# Patient Record
Sex: Male | Born: 1950 | Race: White | Hispanic: No | Marital: Single | Smoking: Never smoker
Health system: Southern US, Community
[De-identification: ages and names within clinical notes are randomized; demographics above are authoritative.]

## PROBLEM LIST (undated history)

## (undated) DIAGNOSIS — G40909 Epilepsy, unspecified, not intractable, without status epilepticus: Secondary | ICD-10-CM

## (undated) DIAGNOSIS — F329 Major depressive disorder, single episode, unspecified: Secondary | ICD-10-CM

## (undated) DIAGNOSIS — F79 Unspecified intellectual disabilities: Secondary | ICD-10-CM

## (undated) DIAGNOSIS — G809 Cerebral palsy, unspecified: Secondary | ICD-10-CM

## (undated) HISTORY — DX: Epilepsy, unspecified, not intractable, without status epilepticus: G40.909

## (undated) HISTORY — DX: Major depressive disorder, single episode, unspecified: F32.9

---

## 1998-03-25 ENCOUNTER — Other Ambulatory Visit: Admission: RE | Admit: 1998-03-25 | Discharge: 1998-03-25 | Payer: Self-pay | Admitting: Family Medicine

## 1999-10-20 ENCOUNTER — Emergency Department (HOSPITAL_COMMUNITY): Admission: EM | Admit: 1999-10-20 | Discharge: 1999-10-20 | Payer: Self-pay | Admitting: *Deleted

## 2000-09-13 ENCOUNTER — Emergency Department (HOSPITAL_COMMUNITY): Admission: EM | Admit: 2000-09-13 | Discharge: 2000-09-13 | Payer: Self-pay | Admitting: Emergency Medicine

## 2000-09-20 ENCOUNTER — Emergency Department (HOSPITAL_COMMUNITY): Admission: EM | Admit: 2000-09-20 | Discharge: 2000-09-20 | Payer: Self-pay | Admitting: Emergency Medicine

## 2000-09-20 ENCOUNTER — Encounter: Payer: Self-pay | Admitting: Emergency Medicine

## 2001-02-23 ENCOUNTER — Emergency Department (HOSPITAL_COMMUNITY): Admission: EM | Admit: 2001-02-23 | Discharge: 2001-02-23 | Payer: Self-pay | Admitting: Emergency Medicine

## 2001-02-23 ENCOUNTER — Encounter: Payer: Self-pay | Admitting: Emergency Medicine

## 2002-01-11 ENCOUNTER — Encounter: Payer: Self-pay | Admitting: Emergency Medicine

## 2002-01-11 ENCOUNTER — Emergency Department (HOSPITAL_COMMUNITY): Admission: EM | Admit: 2002-01-11 | Discharge: 2002-01-11 | Payer: Self-pay | Admitting: Emergency Medicine

## 2003-02-24 ENCOUNTER — Encounter: Payer: Self-pay | Admitting: Emergency Medicine

## 2003-02-24 ENCOUNTER — Emergency Department (HOSPITAL_COMMUNITY): Admission: EM | Admit: 2003-02-24 | Discharge: 2003-02-24 | Payer: Self-pay | Admitting: Emergency Medicine

## 2004-03-18 ENCOUNTER — Emergency Department (HOSPITAL_COMMUNITY): Admission: EM | Admit: 2004-03-18 | Discharge: 2004-03-18 | Payer: Self-pay | Admitting: Family Medicine

## 2004-03-20 ENCOUNTER — Emergency Department (HOSPITAL_COMMUNITY): Admission: EM | Admit: 2004-03-20 | Discharge: 2004-03-20 | Payer: Self-pay | Admitting: Emergency Medicine

## 2005-10-25 ENCOUNTER — Inpatient Hospital Stay (HOSPITAL_COMMUNITY): Admission: EM | Admit: 2005-10-25 | Discharge: 2005-10-28 | Payer: Self-pay | Admitting: Emergency Medicine

## 2005-10-25 ENCOUNTER — Encounter (INDEPENDENT_AMBULATORY_CARE_PROVIDER_SITE_OTHER): Payer: Self-pay | Admitting: Specialist

## 2005-10-26 ENCOUNTER — Encounter: Payer: Self-pay | Admitting: Internal Medicine

## 2005-11-21 ENCOUNTER — Emergency Department (HOSPITAL_COMMUNITY): Admission: EM | Admit: 2005-11-21 | Discharge: 2005-11-21 | Payer: Self-pay | Admitting: Family Medicine

## 2005-11-23 ENCOUNTER — Emergency Department (HOSPITAL_COMMUNITY): Admission: EM | Admit: 2005-11-23 | Discharge: 2005-11-23 | Payer: Self-pay | Admitting: Family Medicine

## 2006-02-27 ENCOUNTER — Emergency Department (HOSPITAL_COMMUNITY): Admission: EM | Admit: 2006-02-27 | Discharge: 2006-02-27 | Payer: Self-pay | Admitting: Family Medicine

## 2007-08-02 ENCOUNTER — Emergency Department (HOSPITAL_COMMUNITY): Admission: EM | Admit: 2007-08-02 | Discharge: 2007-08-02 | Payer: Self-pay | Admitting: Emergency Medicine

## 2011-01-09 ENCOUNTER — Encounter: Payer: Self-pay | Admitting: Family Medicine

## 2011-05-06 NOTE — H&P (Signed)
NAME:  Howard Best, Howard Best NO.:  [id number]   MEDICAL RECORD NO.:  [id number]          PATIENT TYPE:  EMS   LOCATION:  ED                           FACILITY:  [facility]   PHYSICIAN:  Nelma Rothman, MD   DATE OF BIRTH:  10/06/1951   DATE OF ADMISSION:  10/25/2005  DATE OF DISCHARGE:                                HISTORY & PHYSICAL   PRIMARY CARE PHYSICIAN:  Dr. Dara Hoyer   CHIEF COMPLAINT:  Melena and coffee-ground emesis.   HISTORY OF PRESENT ILLNESS:  The patient is a 60 year old male with a  history of palsy who was brought in by his caregiver after the onset of  liquid, black, foul-smelling diarrhea last night. He reportedly had already  this morning. He was also noted to have some black vomit, but none since his  arrival to the emergency department. There is actually a small amount of  what appears to be coffee-ground emesis on the front of his T-shirt. He also  has a history of mental retardation so the history is somewhat limited,  although he reportedly did complain of some abdominal pain earlier today. He  is accompanied by his caregiver as well as his mother, who is his healthcare  power of attorney. The stool guaiac was positive. There is no history of  nonsteroidals or alcohol use.   PAST MEDICAL HISTORY:  1.  Cerebral palsy.  2.  Seizure disorder.   No known drug allergies.   MEDICATIONS:  1.  Depakote 500 mg p.o. t.i.d.  2.  Gabapentin 600 mg p.o. q.6h.  3.  Lexapro 20 mg p.o. daily.  4.  Colace 100 mg one p.o. q.h.s.  5.  Monistat 2% cream applied b.i.d. to the groin.  6.  Amoxicillin.   SOCIAL HISTORY:  He lives at [place]. He denies any tobacco,  alcohol, or IV drug use.   FAMILY HISTORY:  Positive for heart disease in the mother but no history of  cancer other than skin cancer.   REVIEW OF SYSTEMS:  Again, somewhat limited as the patient is unable to  relate ______________ to vomiting and melena as described in HPI.  Otherwise,  10-point review of systems is negative per the caregiver.   PHYSICAL EXAMINATION:  VITAL SIGNS:  Temperature 99.2, pulse 87, blood  pressure 124/84, respiratory rate 20, saturating 97% on room air.  GENERAL:  He is in no apparent distress.  HEENT:  Mucous membranes are moist. There are no exudates.  NECK:  Supple with no lymphadenopathy and no thyromegaly.  HEART:  Regular rate and rhythm with no murmurs, rubs, or gallops.  LUNGS:  Clear to auscultation bilaterally with no wheezes or crackles.  ABDOMEN:  Soft, nontender, nondistended, with normal active bowel sounds.  EXTREMITIES:  There is no edema.  RECTAL:  Stool guaiac is positive per the emergency department.  NEUROLOGIC:  He follows ___________.   ____________, chloride 98, bicarb 28, BUN 22, creatinine 0.7, glucose 94,  calcium 8.4, total protein 6, albumin 3, AST 27, ALP 20, alkaline  phosphatase 33, total bilirubin 0.9. White blood cell count 13.4,  hemoglobin  15.3, hematocrit 43.9, and platelet count 99. Acute abdominal series is  pending.   ASSESSMENT AND PLAN:  1.  Presumed upper gastrointestinal bleed. Most likely differential includes      gastritis versus peptic ulcer disease. Will place on IV Protonix. The      patient will n.p.o.  2.  Seizure disorder. Continue gabapentin and Depakote.  3.  Thrombocytopenia of unclear duration. It may be that this goes along      with viral etiology, consistent with his symptomatology including sore      throat and low-grade fevers yesterday. Will continue to monitor.  4.  Anticipate back to assisted living facility when medically stable.           ______________________________  Nelma Rothman, MD     RAR/MEDQ  D:  10/25/2005  T:  10/25/2005  Job:  [id number]

## 2011-05-06 NOTE — Discharge Summary (Signed)
NAME:  Howard Best, Howard Best              ACCOUNT NO.:  [id number]   MEDICAL RECORD NO.:  [id number]          PATIENT TYPE:  INP   LOCATION:  2536                         FACILITY:  [facility]   PHYSICIAN:  Hillery Aldo, M.D.   DATE OF BIRTH:  [birth year]   DATE OF ADMISSION:  10/25/2005  DATE OF DISCHARGE:                                 DISCHARGE SUMMARY   PRIMARY CARE PHYSICIAN:  Dr. Dara Hoyer   GASTROENTEROLOGIST:  Dr. Elnoria Howard   NEUROLOGIST:  Dr. Lesia Sago   DISCHARGE DIAGNOSES:  1.  Upper gastrointestinal bleed secondary to gastric ulcers, status post      esophagogastroduodenoscopy.  2.  Thrombocytopenia of uncertain etiology, possibly a reaction to Depakote.  3.  Transient coagulopathy, resolved.  4.  Seizure disorder.  5.  Fever with leukocytosis, likely secondary to viral pharyngitis.  6.  History of elevated hemoglobin.   DISCHARGE MEDICATIONS:  1.  Protonix 40 mg b.i.d. x1 month, then decrease to daily.  2.  Neurontin 600 mg p.o. q.i.d.  3.  Monistat 2% cream to groin b.i.d.  4.  Lexapro 20 mg p.o. daily.  5.  Topamax 25 mg p.o. b.i.d. x1 week, then 25 mg q.a.m. and 50 mg q.p.m. x1      week, then 50 mg p.o. b.i.d.  6.  Depakote 500 mg b.i.d. x3 weeks, then decrease to 500 mg daily x3 weeks,      then stop.   CONSULTATIONS:  1.  Dr. Elnoria Howard of gastrointestinal.  2.  Dr. Anne Hahn of neurology.   PROCEDURES AND DIAGNOSTIC STUDIES:  1.  Acute abdominal series on October 25, 2005, showed a mild ileus with no      free intraperitoneal gas, a dysplastic right hip joint, and cardiomegaly      with vascular congestion and bibasilar atelectasis.  2.  Abdominal ultrasound on October 26, 2005, showed no evidence for      cirrhosis. The sonogram was normal.  3.  Esophagogastroduodenoscopy on October 26, 2005, showed superficial      gastric ulcers.  4.  Repeat chest x-ray on October 26, 2005, showed some air space disease in      the right lower lung zone thought to represent  atelectasis or early      infection.   DISCHARGE LABORATORY VALUES:  Hemoglobin was 13.8, hematocrit 38.4, white  blood cell count 9.0, and platelets 73. Ferritin was 117, transferrin 143,  iron 29, total iron binding capacity 197, and percent saturation 15. Sodium  was 138, potassium 4.0, chloride 103, bicarb 28, BUN 7, creatinine 0.7, and  glucose 87. Rapid strep screen was negative. At the time of this dictation,  urine and blood cultures are negative.   BRIEF ADMISSION HISTORY OF PRESENT ILLNESS:  The patient is a 60 year old  male with a past medical history of mental retardation and seizure disorder  who is admitted for workup of hematemesis and heme positive stools. The  patient has a history of thrombocytopenia dating back for approximately 1  month. He was admitted for further evaluation and consultation with  gastrointestinal for an EGD.  HOSPITAL COURSE BY PROBLEM:  #1 - UPPER GASTROINTESTINAL BLEED. The patient  was admitted and given IV fluid rehydration. Dr. Elnoria Howard was called in  consultation and proceed with an upper endoscopy on October 26, 2005. The  endoscopy did show some superficial gastric ulcers and it was advised that  the patient continue on proton pump inhibitor therapy twice daily for 1  month and then daily. His hemoglobin remained stable throughout the course  of his hospitalization with no further episodes of hematemesis or melena. He  is stable for discharge but should have his hemoglobin and hematocrit  followed closely for the next few weeks.   #2 - THROMBOCYTOPENIA. The patient's primary care physician was contacted  and the patient had a documented platelet count of 99 in August 2006. Prior  to this, his platelets were normal. There is no underlying evidence of liver  pathology to explain his thrombocytopenia. Given this, it was thought that  Depakote may be inducing thrombocytopenia so neurology was consulted to see  if there were alternative  anti-seizure medications that could be used. He  was seen by Dr. Anne Hahn who recommended starting Topamax and a very slow  titration off the Depakote. He should follow up with Dr. Anne Hahn as an  outpatient.   #3 - COAGULOPATHY. The patient did have one elevated protime. All other  liver function studies were normal. On repeat check, his protime was normal.  It is unclear if this was just a spurious laboratory error or a transient  coagulopathy.   #4 - SEIZURE DISORDER. The patient remained stable on his usual  anticonvulsants. He was started on Topamax with a dose titration down of his  Depakote with a plan to slowly titrate off the Depakote.   #5 - FEVER WITH LEUKOCYTOSIS. The only complaint the patient had was  pharyngitis. He was pan cultured with blood and urine cultures as well as a  rapid strep screen on his posterior pharynx. All culture data has so far  been negative. His rapid strep was negative as well. It is likely that he  has a viral pharyngitis. No antibiotics were administered during the course  of his hospital stay and his white blood cell count has normalized.   #6 - HISTORY OF ELEVATED HEMOGLOBIN. Given the patient's history of elevated  hemoglobin, a ferritin, transferrin, and iron studies were done. There was  no evidence of hemachromatosis based on the results, which are noted above.   #7 - DISPOSITION. The patient will be discharged to his assisted living  facility today. He should follow up with Dr. Anne Hahn in 2-4 weeks and with  his primary care physician next week for a check of his hemoglobin.           ______________________________  Hillery Aldo, M.D.     CR/MEDQ  D:  10/28/2005  T:  10/28/2005  Job:  [id number]   cc:   Teena Irani. Arlyce Dice, M.D.  Fax: [phone]   C. Lesia Sago, M.D.  Fax: [phone]   Jordan Hawks. Elnoria Howard, MD  Fax: [phone]

## 2011-05-06 NOTE — Consult Note (Signed)
NAME:  Howard Best, Howard Best NO.:  [id number]   MEDICAL RECORD NO.:  [id number]          PATIENT TYPE:  INP   LOCATION:  6712                         FACILITY:  [facility]   PHYSICIAN:  Marlan Palau, M.D.  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  This patient was admitted for  problem with emesis coffee-ground material, black stools, heme-positive.  Patient has been on Depakote and Neurontin for seizures which are somewhat  active, having several a month.  Patient's last seizure that was definite  was approximately three weeks ago.  Patient has had some chronic problems  with thrombocytopenia with platelet level around 100.  Patient has had some  drop in the platelet level to around 88.  Due to the recent GI bleed, some  question whether he will be able to come off of the Depakote and be switched  to something else as this may be the source of thrombocytopenia.  Neurology  is consulted for this reason.   PAST MEDICAL HISTORY:  1.  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  2.  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  3.  History of seizure disorder.  4.  Mental retardation.  5.  GI bleed as above.   CURRENT MEDICATIONS:  1.  Depakote 500 mg t.i.d.  2.  Lexapro 20 mg daily.  3.  Neurontin 600 mg q.6h.  4.  Protonix 40 mg b.i.d.  5.  Patient is on some Tylenol p.r.n.   ADMISSION LABORATORY DATA:  NO KNOWN DRUG ALLERGIES.   Does not smoke or drink.   SOCIAL HISTORY:  Patient is single.  Lives in [facility].  Patient has at  least two other siblings.   FAMILY HISTORY:  Notable for problems with heart disease in mother.  History  of  skin cancer in the family.   REVIEW OF SYSTEMS:  Very difficult to obtain.  Patient denies any headache,  chest pain, abdominal pain.  Really does not talk much otherwise.  Patient  just in general does not feel well.   PHYSICAL EXAMINATION:  GENERAL APPEARANCE:  This patient is a microcephalic,  alert male who is at times cooperative and other times not.  VITAL SIGNS:  Blood pressure is 119/73, heart rate 83, respiratory rate 20,  temperature 100.3, T-max 101.3.  HEENT:  Head is atraumatic.  Eyes:  Pupils are round and reactive to light.  Discs are difficult to visualize but appear to be flat bilaterally. Patient  does have frequent coughing.  NECK:  Supple.  No carotid bruits noted.  RESPIRATORY:  Clear.  CARDIOVASCULAR:  Regular rate and rhythm with no obvious murmurs or rubs  noted.  EXTREMITIES:  Patient has no significant edema of the extremities.  Arms and  legs are in flexion.  NEUROLOGIC:  Patient cooperates poorly with this.  He does blink to threat  bilaterally.  Otherwise visual field testing is difficult.  Patient will  verbalize some, somewhat dysarthric speech.  Patient has ability to grip  with both hands, right greater than left.  Really will not follow commands  well with either lower extremity.  Deep tendon reflexes are fairly  symmetric.  Toes are neutral bilaterally.  Patient will not follow commands  for cerebellar testing, other than he will try to grasp examiner's hands and  can do this with both arms.  Patient was not ambulated.   LABORATORY DATA:  Notable for white count of 8.7, hemoglobin 14.0,  hematocrit 40.7, MCV 98.6, platelets 88.  INR 1.0.  Admission INR of 1.5.  Sodium 133, potassium 4.1, chloride 98, CO2 28, glucose 94, BUN 22,  creatinine 0.7.  Total bilirubin 0.9, alkaline phosphatase 33, SGOT 27, SGPT  20, total protein 6.0, albumin 3.0, calcium 8.4.   IMPRESSION:  1.  History of chronic seizure disorder under poor control.  2.  Static  encephalopathy with mental retardation.  3.  Gastrointestinal bleed secondary to gastritis.  4.  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   At this point, will attempt to switch patient over from Depakote to Topamax.  Will gradually taper off Depakote by one tablet every three to four weeks  and get patient up to 50 mg twice a day with the Topamax by going up by 25  mg a week.  Patient will need to follow up through our office in about six  weeks.  Need to watch out for increasing seizure frequency.  We need to  follow platelets on a regular basis once he is discharged.      Marlan Palau, M.D.  Electronically Signed     CKW/MEDQ  D:  10/26/2005  T:  10/27/2005  Job:  [id number]   cc:   [facility]  [address]  [address]

## 2011-05-06 NOTE — Consult Note (Signed)
NAME:  Howard Best, Howard Best              ACCOUNT NO.:  [id number]   MEDICAL RECORD NO.:  [id number]          PATIENT TYPE:  INP   LOCATION:  0102                         FACILITY[facility]  [facility]   PHYSICIAN:  Jordan Hawks. Elnoria Howard, MD    DATE OF BIRTH:  22-Jun-1951   DATE OF CONSULTATION:  10/25/2005  DATE OF DISCHARGE:                                   CONSULTATION   REASON FOR CONSULTATION:  Melena.   REFERRING PHYSICIAN:  Teena Irani. Arlyce Dice, M.D.   HISTORY OF PRESENT ILLNESS:  This is a 60 year old white male with a history  of cerebral palsy and mental retardation who is brought into the hospital  for further evaluation of reported melena.  The patient lives in a group  home, and the nurse at the group home states that he had black vomitus and  also melena that was acute.  There is no prior history of these type of  findings.  Recently, the patient was evaluated by Dr. Arlyce Dice at [facility], I believe on Sunday or Monday, for an upper respiratory  infection.  At that time, he was uncooperative to be able to provide a  sputum culture, and he was empirically treated.  There was also a report  that he was started on ibuprofen for general aches and pains.  He has no  prior history of NSAID use or any known alcohol use.  The history is  difficult to obtain from the patient, as he is nonverbal at this time.  The  history was obtained from his mother, Howard Best, who is the power of  attorney.  Her phone number is [phone].   PAST MEDICAL HISTORY:  As stated above.   PAST SURGICAL HISTORY:  As stated above.   ALLERGIES:  No known drug allergies.   MEDICATIONS:  1.  Depakote 500 mg p.o. t.i.d.  2.  Gabapentin 600 mg q.6h.  3.  Lexapro 20 mg daily.  4.  Colace 100 mg q.h.s.  5.  Monistat 2% cream b.i.d. to groin.  6.  Amoxicillin.   SOCIAL HISTORY:  Patient lives at [facility].  No history of any  tobacco, alcohol, or illegal drug use.   FAMILY HISTORY:   Noncontributory for the current admission.   REVIEW OF SYSTEMS:  Unable to obtain at this time.   PHYSICAL EXAMINATION:  VITAL SIGNS:  Blood pressure 124/84, heart rate 87,  respirations 18, temperature 99.2.  Pulse ox is 97%.  GENERAL:  The patient is awake.  He is not communicating verbally at this  time.  HEENT:  Appears to be normocephalic and atraumatic.  Extraocular muscles are  intact.  Pupils are equal, round and reactive to light.  NECK:  Supple with no lymphadenopathy.  LUNGS:  Difficult to examine; however, the right posterior and anterior  lungs are clear, as unable to adequately assess his left lung secondary to  his positioning.  CARDIOVASCULAR:  Regular rate and rhythm without murmurs, rubs or gallops.  ABDOMEN:  Flat, soft, nontender, nondistended.  Unable to palpate for any  hepatosplenomegaly.  EXTREMITIES:  No clubbing,  cyanosis or edema.  SKIN:  There is no evidence of any telangiectasias or palmar erythema.  RECTAL:  Negative for any palpable masses.  There is gross evidence of  melena, and it is guaiac positive.   LABORATORY VALUES:  White blood cell count 13.4, hemoglobin 15.3, platelet  count 99.  Sodium 133, potassium 4.1, chloride 98, CO2 28, BUN 22,  creatinine 0.7, glucose 94, AST 27, ALT 20, alk phos 33, total bili 0.9,  albumin 3.  PT is 18.2, INR 1.5.   IMPRESSION:  1.  Melena/coffee-ground emesis.  2.  Thrombocytopenia.  3.  Elevated INR.  4.  Cerebral palsy.  5.  Seizure disorder.   I have evaluated the patient.  It is clear that he does have melena at this  time.  Because of his uncooperative nature with the throat culture, an NG  tube will not be pursued at this time.  He is hemodynamically stable, and  there is no evidence of any rapid GI blood loss.  He does require further  evaluation with an upper endoscopy.  I am uncertain why the patient has an  elevated INR as well as thrombocytopenia.  There is no physical evidence of  cirrhosis at  this time, and he does not have any risk factors.  Review of  his medication does not raise any suspicions for medication-induced  cirrhosis.  Additionally, the patient does not appear to be in a septic  state, which can result in these types of abnormalities.   PLAN:  1.  EEG tomorrow.  2.  Agree with Protonix.  3.  Follow H&H.  4.  Transfuse as necessary.  5.  Hold all NSAIDs.  6.  Repeat PT/INR in the following days as well as a CBC to discern any      change in the initial abnormalities.  The patient may require a CT scan      of the abdomen in order to evaluate for any liver abnormalities or      evidence of portal hypertension.  The EGD will also help to define if      there is any evidence of varices.      Jordan Hawks Elnoria Howard, MD  Electronically Signed     PDH/MEDQ  D:  10/25/2005  T:  10/25/2005  Job:  [id number]   cc:   Teena Irani. Arlyce Dice, M.D.  Fax: [phone]

## 2020-09-10 ENCOUNTER — Emergency Department (HOSPITAL_COMMUNITY): Payer: Medicare Other

## 2020-09-10 ENCOUNTER — Encounter (HOSPITAL_COMMUNITY): Payer: Self-pay | Admitting: Emergency Medicine

## 2020-09-10 ENCOUNTER — Emergency Department (HOSPITAL_BASED_OUTPATIENT_CLINIC_OR_DEPARTMENT_OTHER): Payer: Medicare Other

## 2020-09-10 ENCOUNTER — Emergency Department (HOSPITAL_COMMUNITY)
Admission: EM | Admit: 2020-09-10 | Discharge: 2020-09-10 | Disposition: A | Discharge status: Home / Self Care | Payer: Medicare Other | Attending: Emergency Medicine | Admitting: Emergency Medicine

## 2020-09-10 DIAGNOSIS — R609 Edema, unspecified: Secondary | ICD-10-CM | POA: Diagnosis not present

## 2020-09-10 HISTORY — DX: Unspecified intellectual disabilities: F79

## 2020-09-10 HISTORY — DX: Cerebral palsy, unspecified: G80.9

## 2020-09-10 LAB — CBC WITH DIFFERENTIAL/PLATELET
Abs Immature Granulocytes: 0.02 10*3/uL (ref 0.00–0.07)
Basophils Absolute: 0.1 10*3/uL (ref 0.0–0.1)
Basophils Relative: 1 %
Eosinophils Absolute: 0.2 10*3/uL (ref 0.0–0.5)
Eosinophils Relative: 2 %
HCT: 45.3 % (ref 39.0–52.0)
Hemoglobin: 15.4 g/dL (ref 13.0–17.0)
Immature Granulocytes: 0 %
Lymphocytes Relative: 31 %
Lymphs Abs: 2 10*3/uL (ref 0.7–4.0)
MCH: 32.6 pg (ref 26.0–34.0)
MCHC: 34 g/dL (ref 30.0–36.0)
MCV: 95.8 fL (ref 80.0–100.0)
Monocytes Absolute: 0.8 10*3/uL (ref 0.1–1.0)
Monocytes Relative: 12 %
Neutro Abs: 3.6 10*3/uL (ref 1.7–7.7)
Neutrophils Relative %: 54 %
Platelets: 248 10*3/uL (ref 150–400)
RBC: 4.73 MIL/uL (ref 4.22–5.81)
RDW: 12.8 % (ref 11.5–15.5)
WBC: 6.6 10*3/uL (ref 4.0–10.5)
nRBC: 0 % (ref 0.0–0.2)

## 2020-09-10 LAB — URINALYSIS, ROUTINE W REFLEX MICROSCOPIC
Bilirubin Urine: NEGATIVE
Glucose, UA: NEGATIVE mg/dL
Hgb urine dipstick: NEGATIVE
Ketones, ur: NEGATIVE mg/dL
Leukocytes,Ua: NEGATIVE
Nitrite: NEGATIVE
Protein, ur: NEGATIVE mg/dL
Specific Gravity, Urine: 1.011 (ref 1.005–1.030)
pH: 7 (ref 5.0–8.0)

## 2020-09-10 LAB — BASIC METABOLIC PANEL
Anion gap: 9 (ref 5–15)
BUN: 18 mg/dL (ref 8–23)
CO2: 24 mmol/L (ref 22–32)
Calcium: 9.3 mg/dL (ref 8.9–10.3)
Chloride: 105 mmol/L (ref 98–111)
Creatinine, Ser: 0.81 mg/dL (ref 0.61–1.24)
GFR calc Af Amer: >60 mL/min (ref >60)
GFR calc non Af Amer: >60 mL/min (ref >60)
Glucose, Bld: 78 mg/dL (ref 70–99)
Potassium: 4 mmol/L (ref 3.5–5.1)
Sodium: 138 mmol/L (ref 135–145)

## 2020-09-10 NOTE — ED Notes (Signed)
Male external condom catheter placed on patient 

## 2020-09-10 NOTE — ED Triage Notes (Addendum)
Pt BIB EMS from [facility] group home c/o bilateral LE edema. Extremities cold at baseline. Pulses palpable bilaterally. Denies other complaints. Hx of CP and intellectual diabilities. Vitals stable. A&O to baseline.   118 palpated  60 pulse 16 RR 96% RA 109 CBG

## 2020-09-10 NOTE — ED Provider Notes (Signed)
[facility]-EMERGENCY DEPT Provider Note   CSN: [id number] Arrival date & time: 09/10/20  1626     History Chief Complaint  Patient presents with  . Leg Swelling    Howard Best is a 69 y.o. male.  New onset bilateral lower extremity swelling.  Patient symptoms started, or were noticed, today.  He did hit his ankle on a wheelchair few days ago.  Has known osteoporosis.  No history of blood clots.  No recent illness otherwise.  Eating and drinking normally.  No other symptoms.  No shortness of breath just        Past Medical History:  Diagnosis Date  . Cerebral palsy (HCC)   . Intellectual disability     There are no problems to display for this patient.    The histories are not reviewed yet. Please review them in the "History" navigator section and refresh this SmartLink.     No family history on file.  Social History   Tobacco Use  . Smoking status: Not on file  Substance Use Topics  . Alcohol use: Not on file  . Drug use: Not on file    Home Medications Prior to Admission medications   Not on File    Allergies    Patient has no allergy information on record.  Review of Systems   Review of Systems  Unable to perform ROS: Patient nonverbal  Constitutional: Negative for appetite change, chills and fever.  Respiratory: Negative for shortness of breath.   Cardiovascular: Negative for chest pain.  Gastrointestinal: Negative for constipation, diarrhea, nausea and vomiting.  Genitourinary: Negative for difficulty urinating.  He will communicate some with people he knows per facility nursing team Physical Exam Updated Vital Signs BP 104/79   Pulse 65   Temp 98.3 F (36.8 C) (Oral)   Resp 17   SpO2 95%   Physical Exam Vitals and nursing note reviewed.  Constitutional:      General: He is not in acute distress.    Appearance: Normal appearance.  HENT:     Head: Normocephalic and atraumatic.     Nose: No rhinorrhea.  Eyes:      General:        Right eye: No discharge.        Left eye: No discharge.     Conjunctiva/sclera: Conjunctivae normal.  Cardiovascular:     Rate and Rhythm: Normal rate and regular rhythm.  Pulmonary:     Effort: Pulmonary effort is normal.     Breath sounds: No stridor.  Abdominal:     General: Abdomen is flat. There is no distension.     Palpations: Abdomen is soft.     Tenderness: There is no abdominal tenderness.  Musculoskeletal:     Comments: Significant contractures in all 4 extremities, bilateral extremities with mild edema to just proximal to the ankle.  No tenderness, intact pulses  Skin:    General: Skin is warm and dry.  Neurological:     Mental Status: He is alert. Mental status is at baseline.     ED Results / Procedures / Treatments   Labs (all labs ordered are listed, but only abnormal results are displayed) Labs Reviewed  CBC WITH DIFFERENTIAL/PLATELET  BASIC METABOLIC PANEL  URINALYSIS, ROUTINE W REFLEX MICROSCOPIC    EKG None  Radiology DG Ankle Complete Right  Result Date: 09/10/2020 CLINICAL DATA:  Edema, hit right ankle on wheelchair EXAM: RIGHT ANKLE - COMPLETE 3+ VIEW COMPARISON:  None. FINDINGS:  Frontal, oblique, and lateral views of the right ankle demonstrate no fractures. Alignment is anatomic. 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 The bones are diffusely osteopenic. Diffuse right lower extremity edema. IMPRESSION: 1. Diffuse soft tissue edema. 2. No acute bony abnormality. 3. Osteopenia. Electronically Signed   By: Sharlet Salina M.D.   On: 09/10/2020 17:59   DG Chest Portable 1 View  Result Date: 09/10/2020 CLINICAL DATA:  Bilateral lower extremity edema EXAM: PORTABLE CHEST 1 VIEW COMPARISON:  11/21/2005 FINDINGS: 2 frontal views of the chest are obtained with the patient rotated toward the left. The cardiac silhouette is unremarkable. There is chronic central vascular congestion. Increased retrocardiac density could reflect hiatal  hernia versus left basilar consolidation. Evaluation is limited due to rotation and positioning. No effusion or pneumothorax. IMPRESSION: 1. Increased retrocardiac density which could reflect hiatal hernia or left basilar consolidation. Evaluation limited by patient rotation and portable technique. 2. Mild central vascular congestion. Electronically Signed   By: Sharlet Salina M.D.   On: 09/10/2020 17:58   VAS [place] LOWER EXTREMITY VENOUS (DVT) (ONLY [facility] & [facility])  Result Date: 09/10/2020  Lower Venous DVTStudy Indications: Chronic edema bilateral lower extremities.  Limitations: Patient's legs extremely contracted; many images obtained from atypical approaches. Unable to visualize many vessels due to contracture. Comparison Study: No prior study Performing Technologist: Gertie Fey MHA, RDMS, RVT, RDCS  Examination Guidelines: A complete evaluation includes B-mode imaging, spectral Doppler, color Doppler, and power Doppler as needed of all accessible portions of each vessel. Bilateral testing is considered an integral part of a complete examination. Limited examinations for reoccurring indications may be performed as noted. The reflux portion of the exam is performed with the patient in reverse Trendelenburg.  +---------+---------------+---------+-----------+----------+--------------+ RIGHT    CompressibilityPhasicitySpontaneityPropertiesThrombus Aging +---------+---------------+---------+-----------+----------+--------------+ FV DistalFull                    Yes                                 +---------+---------------+---------+-----------+----------+--------------+ POP      Full           Yes      Yes                                 +---------+---------------+---------+-----------+----------+--------------+   Right Technical Findings: Not visualized segments include SFJ, CFV, FV proximal and mid, PFV, PTV, peroneal veins.   +---------+---------------+---------+-----------+----------+--------------+ LEFT     CompressibilityPhasicitySpontaneityPropertiesThrombus Aging +---------+---------------+---------+-----------+----------+--------------+ FV DistalFull                                                        +---------+---------------+---------+-----------+----------+--------------+ POP      Full                                                        +---------+---------------+---------+-----------+----------+--------------+ PTV      Full                    Yes                                 +---------+---------------+---------+-----------+----------+--------------+  PERO     Full                    Yes                                 +---------+---------------+---------+-----------+----------+--------------+   Left Technical Findings: Not visualized segments include SFJ, CFV, FV proximal and mid. Limited evaluation of left popliteal vein.   Summary: RIGHT: - No obvious evidence of DVT involving the distal femoral vein and popliteal vein. Cannot exclude DVT in other non-visualized veins.  LEFT: - No obvious evidence of DVT involving the distal femoral vein, popliteal vein, posterior tibial veins, and peroneal veins. Cannot exclude DVT in other non-visualized veins.  *See table(s) above for measurements and observations. Electronically signed by Lemar Livings MD on 09/10/2020 at 7:08:29 PM.    Final     Procedures Procedures (including critical care time)  Medications Ordered in ED Medications - No data to display  ED Course  I have reviewed the triage vital signs and the nursing notes.  Pertinent labs & imaging results that were available during my care of the patient were reviewed by me and considered in my medical decision making (see chart for details).    MDM Rules/Calculators/A&P                          Patient with history of cerebral palsy, bedbound, minimally communicative,  comes in with lower extremity swelling, the facility is worried for DVT, as well as possible injury to the right ankle.  We will get baseline labs will get imaging.  Vital signs are stable he is afebrile  Ultrasound negative for DVT in the veins we could visualize.  Chest x-ray reviewed by radiology myself shows no acute significant changes.  Laboratory studies reviewed by myself show no significant causes of patient's lower extremity swelling.  He is chair bound and often has his legs down with gravity, mention to the providers that he could do compression stockings are trying keep the legs elevated and they will follow-up with his primary care provider Final Clinical Impression(s) / ED Diagnoses Final diagnoses:  Peripheral edema    Rx / DC Orders ED Discharge Orders    None       Sabino Donovan, MD 09/10/20 2045

## 2020-09-10 NOTE — Progress Notes (Signed)
Bilateral lower extremity venous duplex completed. Refer to "CV Proc" under chart review to view preliminary results.  09/10/2020 6:52 PM Eula Fried., MHA, RVT, RDCS, RDMS

## 2020-09-10 NOTE — ED Notes (Signed)
Caregiver Marlaine Hind can be contacted with any questions/concerns [phone]

## 2020-09-10 NOTE — Discharge Instructions (Addendum)
No emergent life-threatening cause for this patient's edema today.  Things that can be helpful to keep the legs elevated or try compression stockings.

## 2021-08-30 ENCOUNTER — Encounter (HOSPITAL_COMMUNITY): Payer: Self-pay | Admitting: Emergency Medicine

## 2021-08-30 ENCOUNTER — Emergency Department (HOSPITAL_COMMUNITY)
Admission: EM | Admit: 2021-08-30 | Discharge: 2021-08-30 | Disposition: A | Discharge status: Home / Self Care | Payer: Medicare Other | Attending: Emergency Medicine | Admitting: Emergency Medicine

## 2021-08-30 ENCOUNTER — Emergency Department (HOSPITAL_COMMUNITY): Payer: Medicare Other

## 2021-08-30 ENCOUNTER — Other Ambulatory Visit: Payer: Self-pay

## 2021-08-30 DIAGNOSIS — J069 Acute upper respiratory infection, unspecified: Secondary | ICD-10-CM | POA: Insufficient documentation

## 2021-08-30 DIAGNOSIS — R059 Cough, unspecified: Secondary | ICD-10-CM

## 2021-08-30 DIAGNOSIS — J029 Acute pharyngitis, unspecified: Secondary | ICD-10-CM

## 2021-08-30 DIAGNOSIS — Z20822 Contact with and (suspected) exposure to covid-19: Secondary | ICD-10-CM | POA: Insufficient documentation

## 2021-08-30 LAB — RESP PANEL BY RT-PCR (FLU A&B, COVID) ARPGX2
Influenza A by PCR: NEGATIVE
Influenza B by PCR: NEGATIVE
SARS Coronavirus 2 by RT PCR: NEGATIVE

## 2021-08-30 NOTE — ED Provider Notes (Signed)
[facility]-EMERGENCY DEPT Provider Note   CSN: [id number] Arrival date & time: 08/30/21  1143     History Chief Complaint  Patient presents with   Covid Exposure   Cough   Sore Throat    Howard Best is a 70 y.o. male presenting for evaluation of cough and sore throat.  Level 5 caveat as patient has a history of intellectual disability and cerebral palsy.  Patient states he has been having pain in his throat and a cough.  He is unable to tell me anymore.  Denies chest pain or abdominal pain.  Per triage note, patient coming from group home.  Group home reported patient was complaining of sore throat and cough today.  Staff states there was another resident at the home that had COVID recently.  HPI     Past Medical History:  Diagnosis Date   Cerebral palsy (HCC)    Intellectual disability     There are no problems to display for this patient.   History reviewed. No pertinent surgical history.     History reviewed. No pertinent family history.     Home Medications Prior to Admission medications   Not on File    Allergies    Patient has no allergy information on record.  Review of Systems   Review of Systems  Unable to perform ROS: Other  CP and ID Physical Exam Updated Vital Signs BP 124/78 (BP Location: Left Arm)   Pulse 73   Temp 98 F (36.7 C) (Oral)   Resp 18   SpO2 98%   Physical Exam Vitals and nursing note reviewed.  Constitutional:      General: He is not in acute distress.    Appearance: He is well-developed.     Comments: nontoxic  HENT:     Head: Normocephalic and atraumatic.     Mouth/Throat:     Comments: OP mildly erythematous without tonsillar swelling or exudate.  Uvula midline.  No muffled voice.  Handling secretions. Eyes:     Extraocular Movements: Extraocular movements intact.  Cardiovascular:     Rate and Rhythm: Normal rate and regular rhythm.     Pulses: Normal pulses.  Pulmonary:     Effort:  Pulmonary effort is normal.     Breath sounds: Normal breath sounds.     Comments: Clear lung sounds.  Sats 98% on room air Abdominal:     General: There is no distension.     Palpations: There is no mass.     Tenderness: There is no abdominal tenderness. There is no guarding or rebound.  Musculoskeletal:     Cervical back: Normal range of motion.     Comments: Upper extremities held in contraction, baseline.  In wheelchair, baseline  Skin:    General: Skin is warm.     Findings: No rash.  Neurological:     Mental Status: He is alert. Mental status is at baseline.    ED Results / Procedures / Treatments   Labs (all labs ordered are listed, but only abnormal results are displayed) Labs Reviewed  RESP PANEL BY RT-PCR (FLU A&B, COVID) ARPGX2  I-STAT CHEM 8, ED    EKG None  Radiology DG Chest Portable 1 View  Result Date: 08/30/2021 CLINICAL DATA:  Cough, sore throat.  History of cerebral palsy EXAM: PORTABLE CHEST 1 VIEW COMPARISON:  Chest radiograph 09/10/2020 FINDINGS: Evaluation is degraded by patient positioning. The cardiomediastinal silhouette is grossly stable. Lung volumes are low. There is  mixed retrocardiac opacity and lucency suspected to reflect a hiatal hernia. Otherwise, there is no focal consolidation. There is no pulmonary edema. There is no significant pleural effusion. There is no appreciable pneumothorax. There is no acute osseous abnormality. IMPRESSION: 1. Evluation is limited by patient positioning. 2. Retrocardiac opacity again suspected to reflect a hiatal hernia. 3. Otherwise, no focal consolidation or pleural effusion. Electronically Signed   By: Lesia Hausen M.D.   On: 08/30/2021 12:59    Procedures Procedures   Medications Ordered in ED Medications - No data to display  ED Course  I have reviewed the triage vital signs and the nursing notes.  Pertinent labs & imaging results that were available during my care of the patient were reviewed by me and  considered in my medical decision making (see chart for details).    MDM Rules/Calculators/A&P                           Patient presenting for sore throat and cough.  On exam, patient appears nontoxic.  Clear lung sounds.  However due to the limited history, will obtain x-ray to ensure no signs of pneumonia.  OP exam reassuring, doubt strep.  COVID and flu test pending.  COVID and flu negative.  As patient does not have COVID, while labs were ordered initially, these are no longer necessary as kidney function is not a concern as I will not be prescribing any medication.  Chest x-ray viewed and independently interpreted by me, no pneumonia, postinfusion.  Per radiology, there may be a hiatal hernia.  This is likely not contributing to his symptoms.  Discussed findings with Ms. Corine Shelter, the RN from the group home.  Discussed that at this time, patient appears safe for discharge.  Follow-up with PCP.  Return with any worsening symptoms.  At this time, patient appears safe for discharge.  Return precautions given.  Caregiver states she understands and agrees to plan  Final Clinical Impression(s) / ED Diagnoses Final diagnoses:  Sore throat  Cough  Upper respiratory tract infection, unspecified type    Rx / DC Orders ED Discharge Orders     None        Alveria Apley, PA-C 08/30/21 1407    Terrilee Files, MD 08/30/21 2131

## 2021-08-30 NOTE — Discharge Instructions (Signed)
You likely have a viral illness.  This should be treated symptomatically. Use Tylenol or ibuprofen as needed for pain. Use cough syrup as needed for cough and sore throat.  Make sure you stay well-hydrated with water. Wash your hands frequently to prevent spread of infection. Follow-up with your primary care doctor in 1 week if your symptoms are not improving. Return to the emergency room if you develop chest pain, difficulty breathing, or any new or worsening symptoms.  Your COVID and flu test were negative today. Your chest x-ray was negative, did not show any sign of pneumonia or infection.

## 2021-08-30 NOTE — ED Triage Notes (Signed)
Per EMS- Patient is from a group home-[facility]. Staff reported that the patient c/o sore throat and a cough. Staff reported that another resident did have covid at the home.

## 2022-03-22 ENCOUNTER — Other Ambulatory Visit: Payer: Self-pay

## 2022-03-22 ENCOUNTER — Emergency Department (HOSPITAL_COMMUNITY): Payer: Medicare Other

## 2022-03-22 ENCOUNTER — Emergency Department (HOSPITAL_COMMUNITY)
Admission: EM | Admit: 2022-03-22 | Discharge: 2022-03-22 | Disposition: A | Discharge status: Home / Self Care | Payer: Medicare Other | Attending: Emergency Medicine | Admitting: Emergency Medicine

## 2022-03-22 DIAGNOSIS — J101 Influenza due to other identified influenza virus with other respiratory manifestations: Secondary | ICD-10-CM | POA: Insufficient documentation

## 2022-03-22 DIAGNOSIS — R059 Cough, unspecified: Secondary | ICD-10-CM | POA: Diagnosis present

## 2022-03-22 DIAGNOSIS — Z20822 Contact with and (suspected) exposure to covid-19: Secondary | ICD-10-CM | POA: Insufficient documentation

## 2022-03-22 LAB — CBC WITH DIFFERENTIAL/PLATELET
Abs Immature Granulocytes: 0.03 10*3/uL (ref 0.00–0.07)
Basophils Absolute: 0.1 10*3/uL (ref 0.0–0.1)
Basophils Relative: 1 %
Eosinophils Absolute: 0 10*3/uL (ref 0.0–0.5)
Eosinophils Relative: 0 %
HCT: 46.2 % (ref 39.0–52.0)
Hemoglobin: 15.5 g/dL (ref 13.0–17.0)
Immature Granulocytes: 0 %
Lymphocytes Relative: 19 %
Lymphs Abs: 1.6 10*3/uL (ref 0.7–4.0)
MCH: 32.3 pg (ref 26.0–34.0)
MCHC: 33.5 g/dL (ref 30.0–36.0)
MCV: 96.3 fL (ref 80.0–100.0)
Monocytes Absolute: 1.7 10*3/uL — ABNORMAL HIGH (ref 0.1–1.0)
Monocytes Relative: 20 %
Neutro Abs: 5.1 10*3/uL (ref 1.7–7.7)
Neutrophils Relative %: 60 %
Platelets: 177 10*3/uL (ref 150–400)
RBC: 4.8 MIL/uL (ref 4.22–5.81)
RDW: 13.4 % (ref 11.5–15.5)
WBC: 8.5 10*3/uL (ref 4.0–10.5)
nRBC: 0 % (ref 0.0–0.2)

## 2022-03-22 LAB — RESP PANEL BY RT-PCR (FLU A&B, COVID) ARPGX2
Influenza A by PCR: POSITIVE — AB
Influenza B by PCR: NEGATIVE
SARS Coronavirus 2 by RT PCR: NEGATIVE

## 2022-03-22 LAB — BASIC METABOLIC PANEL
Anion gap: 8 (ref 5–15)
BUN: 13 mg/dL (ref 8–23)
CO2: 22 mmol/L (ref 22–32)
Calcium: 8.8 mg/dL — ABNORMAL LOW (ref 8.9–10.3)
Chloride: 107 mmol/L (ref 98–111)
Creatinine, Ser: 1.05 mg/dL (ref 0.61–1.24)
GFR, Estimated: >60 mL/min (ref >60)
Glucose, Bld: 89 mg/dL (ref 70–99)
Potassium: 4.2 mmol/L (ref 3.5–5.1)
Sodium: 137 mmol/L (ref 135–145)

## 2022-03-22 MED ORDER — OSELTAMIVIR PHOSPHATE 75 MG PO CAPS: 75.0000 mg | ORAL_CAPSULE | Freq: Two times a day (BID) | ORAL | 0 refills | Status: DC

## 2022-03-22 MED ORDER — OSELTAMIVIR PHOSPHATE 75 MG PO CAPS: 75.0000 mg | ORAL_CAPSULE | ORAL | Status: DC

## 2022-03-22 NOTE — ED Notes (Signed)
Patient verbalizes understanding of discharge instructions. Opportunity for questioning and answers were provided. Armband removed by staff, pt discharged from ED. Wheeled out to lobby, and left with group home staff worker ? ?

## 2022-03-22 NOTE — ED Provider Notes (Signed)
?[facility]-EMERGENCY DEPT ?Provider Note ? ? ?CSN: [id number] ?Arrival date & time: 03/22/22  1523 ? ?LEVEL 5 CAVEAT: CEREBRAL PALSY  ? ?History ?Chief Complaint  ?Patient presents with  ? Chest Pain  ? Cough  ? Sore Throat  ? ? ?LOT MEDFORD is a 71 y.o. male with history of cerebral palsy and intellectual disability who presents to the emergency department with productive cough and low to oxygen saturations at his group home started yesterday.  Nurse provided most of the history and is at bedside.  He mentioned that there is been a respiratory bug that has been going around the group home.  He states that the patient has been more lethargic and coughing.  He is also been complaining of sore throat.  No fevers.  No abdominal pain, nausea, vomit, diarrhea.  Nurse states that his oxygen saturations were approximately 89% at the group home. ? ? ?Chest Pain ?Associated symptoms: cough   ?Cough ?Associated symptoms: chest pain   ?Sore Throat ?Associated symptoms include chest pain.  ? ?  ? ?Home Medications ?Prior to Admission medications   ?Medication Sig Start Date End Date Taking? Authorizing Provider  ?oseltamivir (TAMIFLU) 75 MG capsule Take 1 capsule (75 mg total) by mouth every 12 (twelve) hours. 03/22/22  Yes Teressa Lower, PA-C  ?   ? ?Allergies    ?Patient has no known allergies.   ? ?Review of Systems   ?Review of Systems  ?Respiratory:  Positive for cough.   ?Cardiovascular:  Positive for chest pain.  ?All other systems reviewed and are negative. ? ?Physical Exam ?Updated Vital Signs ?BP 104/60   Pulse 84   Temp 99.3 ?F (37.4 ?C) (Oral)   Resp 16   SpO2 99%  ?Physical Exam ?Vitals and nursing note reviewed.  ?Constitutional:   ?   General: He is not in acute distress. ?   Appearance: Normal appearance.  ?HENT:  ?   Head: Normocephalic and atraumatic.  ?   Comments: Unable to visualize posterior pharynx as patient was noncompliant. ?Eyes:  ?   General:     ?   Right eye: No  discharge.     ?   Left eye: No discharge.  ?Cardiovascular:  ?   Comments: Regular rate and rhythm.  S1/S2 are distinct without any evidence of murmur, rubs, or gallops.  Radial pulses are 2+ bilaterally.  Dorsalis pedis pulses are 2+ bilaterally.  No evidence of pedal edema. ?Pulmonary:  ?   Comments: Scant wheezing. Normal effort.  No respiratory distress.  ?Abdominal:  ?   General: Abdomen is flat. Bowel sounds are normal. There is no distension.  ?   Tenderness: There is no abdominal tenderness. There is no guarding or rebound.  ?Musculoskeletal:  ?   Cervical back: Neck supple.  ?   Comments: Arms are chronically contracted at baseline.  ?Skin: ?   General: Skin is warm and dry.  ?   Findings: No rash.  ?Neurological:  ?   General: No focal deficit present.  ?   Mental Status: He is alert.  ?Psychiatric:     ?   Mood and Affect: Mood normal.     ?   Behavior: Behavior normal.  ? ? ?ED Results / Procedures / Treatments   ?Labs ?(all labs ordered are listed, but only abnormal results are displayed) ?Labs Reviewed  ?RESP PANEL BY RT-PCR (FLU A&B, COVID) ARPGX2 - Abnormal; Notable for the following components:  ?  Result Value  ? Influenza A by PCR POSITIVE (*)   ? All other components within normal limits  ?CBC WITH DIFFERENTIAL/PLATELET - Abnormal; Notable for the following components:  ? Monocytes Absolute 1.7 (*)   ? All other components within normal limits  ?BASIC METABOLIC PANEL - Abnormal; Notable for the following components:  ? Calcium 8.8 (*)   ? All other components within normal limits  ? ? ?EKG ?None ? ?Radiology ?DG Chest 2 View ? ?Result Date: 03/22/2022 ?CLINICAL DATA:  cough and shortness of breath EXAM: CHEST - 2 VIEW.  Markedly rotated patient on frontal view. COMPARISON:  Chest x-ray 08/30/2021 FINDINGS: The heart and mediastinal contours are grossly unremarkable. Hiatal hernia. No focal consolidation. No pulmonary edema. At least trace left pleural effusion. No pneumothorax. No acute osseous  abnormality. IMPRESSION: 1. At least trace left pleural effusion. 2. Hiatal hernia. 3. Limited evaluation due to patient rotation on frontal view. Electronically Signed   By: Tish Frederickson M.D.   On: 03/22/2022 17:13   ? ?Procedures ?Procedures  ? ? ?Medications Ordered in ED ?Medications - No data to display ? ?ED Course/ Medical Decision Making/ A&P ?Clinical Course as of 03/22/22 2032  ?Tue Mar 22, 2022  ?1630 I discussed this case with my attending physician who cosigned this note including patient's presenting symptoms, physical exam, and planned diagnostics and interventions. Attending physician stated agreement with plan or made changes to plan which were implemented.  ? ?Attending physician assessed patient at bedside. ? ? [CF]  ?1753 Patient will not open his mouth to get strep.  We will be unable to obtain this at this time. [CF]  ?  ?Clinical Course User Index ?[CF] Honor Loh M, PA-C  ? ?                        ?Medical Decision Making ?Amount and/or Complexity of Data Reviewed ?Labs: ordered. ?Radiology: ordered. ? ? ?This patient presents to the ED for concern of cough and increased lethargy, this involves an extensive number of treatment options, and is a complaint that carries with it a high risk of complications and morbidity.  The differential diagnosis includes URI, pneumonia, pulmonary edema, UTI. ? ? ?Co morbidities that complicate the patient evaluation ? ?Past Medical History:  ?Diagnosis Date  ? Cerebral palsy (HCC)   ? Intellectual disability   ? ? ?Additional history obtained: ? ?Additional history obtained from nursing note ? ? ?Lab Tests: ? ?I Ordered, and personally interpreted labs.  The pertinent results include: CBC does not show evidence of leukocytosis or anemia.  BMP is normal.  Respiratory panel was positive for influenza A.  This is likely the cause of his symptoms. ? ? ?Imaging Studies ordered: ? ?I ordered imaging studies including chest x-ray ?I independently visualized  and interpreted imaging which showed no evidence of pneumonia ?I agree with the radiologist interpretation ? ? ?Cardiac Monitoring: ? ?The patient was maintained on a cardiac monitor.  I personally viewed and interpreted the cardiac monitored which showed an underlying rhythm of: Normal sinus rhythm ? ? ?Medicines ordered and prescription drug management: ? ?None ? ? ?Problem List / ED Course: ? ?Shortness of breath and decreased O2 sats.  Patient has been around 98% to 100% on room air in the department.  No clinical signs of respiratory distress.  No evidence of pneumonia on chest x-ray.  Patient did test positive for influenza A.  This is likely the cause  of his symptoms.  Given that his symptoms started yesterday and with his comorbidities we will likely start him on Tamiflu.  Findings and results were discussed with nurse at bedside.  They expressed full understanding.  Strict return precautions were given.  He is safe for discharge. ? ? ?Reevaluation: ? ?After the interventions noted above, I reevaluated the patient and found that they have :improved ? ? ?Social Determinants of Health: ? ?Social Determinants of Health with Concerns  ? ?Tobacco Use: Not on file  ?Financial Resource Strain: Not on file  ?Food Insecurity: Not on file  ?Transportation Needs: Not on file  ?Physical Activity: Not on file  ?Stress: Not on file  ?Social Connections: Not on file  ?Intimate Partner Violence: Not on file  ?Depression (PHQ2-9): Not on file  ?Alcohol Screen: Not on file  ?Housing: Not on file  ? ? ?Dispostion: ? ?After consideration of the diagnostic results and the patients response to treatment, I feel that the patent would benefit from outpatient follow-up. ? ?Final Clinical Impression(s) / ED Diagnoses ?Final diagnoses:  ?Influenza A  ? ? ?Rx / DC Orders ?ED Discharge Orders   ? ?      Ordered  ?  oseltamivir (TAMIFLU) 75 MG capsule  Every 12 hours       ? 03/22/22 2032  ? ?  ?  ? ?  ? ? ?  ?Teressa LowerFleming, Karon Cotterill M,  PA-C ?03/22/22 2032 ? ?  ?Gerhard MunchLockwood, Robert, MD ?03/23/22 1029 ? ?

## 2022-03-22 NOTE — ED Triage Notes (Signed)
Patient presents from a group home due to an O2 sat of 90% on room air. Patient complains of chest pain. The group home reports other housemates having respiratory issues.  ? ? ?Hx CP ? ? ?EMS vitals: ?132/74 BP ?114 CBG ?90 HR ?96% SPO2 on room air ?

## 2022-03-22 NOTE — ED Notes (Signed)
Caregiver @ bedside

## 2022-03-22 NOTE — Discharge Instructions (Addendum)
Please follow-up with your primary care doctor for further evaluation.  Return to the emergency room for any worsening symptoms. 

## 2022-04-15 ENCOUNTER — Encounter (HOSPITAL_COMMUNITY): Payer: Self-pay | Admitting: Emergency Medicine

## 2022-04-15 ENCOUNTER — Other Ambulatory Visit: Payer: Self-pay

## 2022-04-15 ENCOUNTER — Inpatient Hospital Stay (HOSPITAL_COMMUNITY)
Admission: EM | Admit: 2022-04-15 | Discharge: 2022-04-21 | DRG: 100 | Disposition: A | Discharge status: Home Health | Payer: Medicare Other | Attending: Family Medicine | Admitting: Family Medicine

## 2022-04-15 ENCOUNTER — Emergency Department (HOSPITAL_COMMUNITY): Payer: Medicare Other

## 2022-04-15 DIAGNOSIS — R569 Unspecified convulsions: Principal | ICD-10-CM

## 2022-04-15 DIAGNOSIS — G9340 Encephalopathy, unspecified: Secondary | ICD-10-CM | POA: Diagnosis not present

## 2022-04-15 DIAGNOSIS — E876 Hypokalemia: Secondary | ICD-10-CM | POA: Diagnosis not present

## 2022-04-15 DIAGNOSIS — F79 Unspecified intellectual disabilities: Secondary | ICD-10-CM | POA: Diagnosis present

## 2022-04-15 DIAGNOSIS — I10 Essential (primary) hypertension: Secondary | ICD-10-CM | POA: Diagnosis present

## 2022-04-15 DIAGNOSIS — G9341 Metabolic encephalopathy: Secondary | ICD-10-CM | POA: Diagnosis present

## 2022-04-15 DIAGNOSIS — M62421 Contracture of muscle, right upper arm: Secondary | ICD-10-CM | POA: Insufficient documentation

## 2022-04-15 DIAGNOSIS — Z66 Do not resuscitate: Secondary | ICD-10-CM | POA: Diagnosis present

## 2022-04-15 DIAGNOSIS — G40909 Epilepsy, unspecified, not intractable, without status epilepticus: Principal | ICD-10-CM | POA: Diagnosis present

## 2022-04-15 DIAGNOSIS — E872 Acidosis, unspecified: Secondary | ICD-10-CM | POA: Diagnosis present

## 2022-04-15 DIAGNOSIS — Z993 Dependence on wheelchair: Secondary | ICD-10-CM | POA: Diagnosis not present

## 2022-04-15 DIAGNOSIS — Z7189 Other specified counseling: Secondary | ICD-10-CM | POA: Insufficient documentation

## 2022-04-15 DIAGNOSIS — Z79899 Other long term (current) drug therapy: Secondary | ICD-10-CM | POA: Diagnosis not present

## 2022-04-15 DIAGNOSIS — Q048 Other specified congenital malformations of brain: Secondary | ICD-10-CM

## 2022-04-15 DIAGNOSIS — G809 Cerebral palsy, unspecified: Secondary | ICD-10-CM | POA: Diagnosis present

## 2022-04-15 LAB — CBC WITH DIFFERENTIAL/PLATELET
Abs Immature Granulocytes: 0.07 10*3/uL (ref 0.00–0.07)
Basophils Absolute: 0.1 10*3/uL (ref 0.0–0.1)
Basophils Relative: 1 %
Eosinophils Absolute: 0.1 10*3/uL (ref 0.0–0.5)
Eosinophils Relative: 2 %
HCT: 41.1 % (ref 39.0–52.0)
Hemoglobin: 13.7 g/dL (ref 13.0–17.0)
Immature Granulocytes: 1 %
Lymphocytes Relative: 19 %
Lymphs Abs: 1.4 10*3/uL (ref 0.7–4.0)
MCH: 32.3 pg (ref 26.0–34.0)
MCHC: 33.3 g/dL (ref 30.0–36.0)
MCV: 96.9 fL (ref 80.0–100.0)
Monocytes Absolute: 0.8 10*3/uL (ref 0.1–1.0)
Monocytes Relative: 11 %
Neutro Abs: 5 10*3/uL (ref 1.7–7.7)
Neutrophils Relative %: 66 %
Platelets: 241 10*3/uL (ref 150–400)
RBC: 4.24 MIL/uL (ref 4.22–5.81)
RDW: 13.9 % (ref 11.5–15.5)
WBC: 7.5 10*3/uL (ref 4.0–10.5)
nRBC: 0 % (ref 0.0–0.2)

## 2022-04-15 LAB — BASIC METABOLIC PANEL
Anion gap: 6 (ref 5–15)
BUN: 18 mg/dL (ref 8–23)
CO2: 20 mmol/L — ABNORMAL LOW (ref 22–32)
Calcium: 8.2 mg/dL — ABNORMAL LOW (ref 8.9–10.3)
Chloride: 112 mmol/L — ABNORMAL HIGH (ref 98–111)
Creatinine, Ser: 1 mg/dL (ref 0.61–1.24)
GFR, Estimated: >60 mL/min (ref >60)
Glucose, Bld: 81 mg/dL (ref 70–99)
Potassium: 4.2 mmol/L (ref 3.5–5.1)
Sodium: 138 mmol/L (ref 135–145)

## 2022-04-15 LAB — CBG MONITORING, ED: Glucose-Capillary: 92 mg/dL (ref 70–99)

## 2022-04-15 MED ORDER — ORAL CARE MOUTH RINSE
15.0000 mL | OROMUCOSAL | Status: DC
  Administered 2022-04-15 – 2022-04-19 (×24): 15 mL via OROMUCOSAL

## 2022-04-15 MED ORDER — ACETAMINOPHEN 325 MG PO TABS
650.0000 mg | ORAL_TABLET | ORAL | Status: DC | PRN
  Administered 2022-04-19 – 2022-04-21 (×2): 650 mg via ORAL
  Filled 2022-04-15 (×2): qty 2

## 2022-04-15 MED ORDER — ONDANSETRON HCL 4 MG/2ML IJ SOLN: 4.0000 mg | Freq: Four times a day (QID) | INTRAMUSCULAR | Status: DC | PRN

## 2022-04-15 MED ORDER — SODIUM CHLORIDE 0.9 % IV SOLN
20.0000 mg/kg | Freq: Once | INTRAVENOUS | Status: AC
  Administered 2022-04-15: 1352 mg via INTRAVENOUS
  Filled 2022-04-15: qty 27.04

## 2022-04-15 MED ORDER — CHLORHEXIDINE GLUCONATE 0.12% ORAL RINSE (MEDLINE KIT)
15.0000 mL | Freq: Two times a day (BID) | OROMUCOSAL | Status: DC
  Administered 2022-04-16 – 2022-04-20 (×5): 15 mL via OROMUCOSAL

## 2022-04-15 MED ORDER — LORAZEPAM 2 MG/ML IJ SOLN
4.0000 mg | INTRAMUSCULAR | Status: DC | PRN
  Administered 2022-04-16: 2 mg via INTRAVENOUS
  Filled 2022-04-15: qty 2

## 2022-04-15 MED ORDER — ENOXAPARIN SODIUM 40 MG/0.4ML IJ SOSY
40.0000 mg | PREFILLED_SYRINGE | INTRAMUSCULAR | Status: DC
  Administered 2022-04-15 – 2022-04-20 (×6): 40 mg via SUBCUTANEOUS
  Filled 2022-04-15 (×6): qty 0.4

## 2022-04-15 MED ORDER — SODIUM CHLORIDE 0.9 % IV BOLUS
1000.0000 mL | Freq: Once | INTRAVENOUS | Status: AC
  Administered 2022-04-15: 1000 mL via INTRAVENOUS

## 2022-04-15 MED ORDER — LEVETIRACETAM IN NACL 1000 MG/100ML IV SOLN
1000.0000 mg | Freq: Once | INTRAVENOUS | Status: AC
Start: 2022-04-15 — End: 2022-04-15
  Administered 2022-04-15: 1000 mg via INTRAVENOUS
  Filled 2022-04-15: qty 100

## 2022-04-15 MED ORDER — ONDANSETRON HCL 4 MG PO TABS: 4.0000 mg | ORAL_TABLET | Freq: Four times a day (QID) | ORAL | Status: DC | PRN

## 2022-04-15 MED ORDER — ACETAMINOPHEN 650 MG RE SUPP: 650.0000 mg | RECTAL | Status: DC | PRN

## 2022-04-15 MED ORDER — LEVETIRACETAM IN NACL 1000 MG/100ML IV SOLN
1000.0000 mg | Freq: Two times a day (BID) | INTRAVENOUS | Status: DC
  Administered 2022-04-15: 1000 mg via INTRAVENOUS
  Filled 2022-04-15: qty 100

## 2022-04-15 MED ORDER — SODIUM CHLORIDE 0.9 % IV SOLN
75.0000 mL/h | INTRAVENOUS | Status: DC
  Administered 2022-04-15: 75 mL/h via INTRAVENOUS

## 2022-04-15 MED ORDER — LEVETIRACETAM IN NACL 1000 MG/100ML IV SOLN
1000.0000 mg | Freq: Once | INTRAVENOUS | Status: AC
  Administered 2022-04-15: 1000 mg via INTRAVENOUS
  Filled 2022-04-15: qty 100

## 2022-04-15 MED ORDER — LORAZEPAM 2 MG/ML IJ SOLN
1.0000 mg | Freq: Once | INTRAMUSCULAR | Status: AC
  Administered 2022-04-15: 1 mg via INTRAVENOUS
  Filled 2022-04-15: qty 1

## 2022-04-15 NOTE — Assessment & Plan Note (Addendum)
Chronic ?Involving right arm/right hand ?- ?

## 2022-04-15 NOTE — Assessment & Plan Note (Addendum)
?   Chronically on Topamax as well as gabapentin ?? Group home staff reports that he has been compliant with medications  ?? no recent changes in meds, no recent fever or other signs of infection ?? He reportedly has approximately 1-2 seizures per month which are usually treated with intranasal diazepam ?? He follows with a neurologist in [place] as needed.  Staff/family unaware of who exactly he sees ?? Had increasing seizures the day prior to admission, with approximately 6 seizures today, all lasting approximately 1 minute ?? Seizure episodes followed by lethargy ?? CT head performed emergency room without acute findings ?? Case reviewed with Dr. Roda Shutters with neurology who requested transfer to [facility] for further neuro evaluation ?? Patient was loaded with IV Keppra 2 g and will be continued on 1.5 g IV every 12 hours ?? -Patient also on Vimpat 100 mg twice daily which he seems to be tolerating. ?? He also received loading dose of fosphenytoin.  ?? Continue to hold Topamax and gabapentin for now and WILL NOT resume on discharge per neurology recommendations.   ?? Patient on continuous EEG which showed moderate diffuse encephalopathy, nonspecific etiology, no seizures or epileptiform discharges were seen throughout the recording. ?? Continuous EEG discontinued.Marland Kitchen ?? -Patient being followed by neurology who recommend holding fosphenytoin at this time as this could worsen patient's somnolence.  Phenytoin levels noted to be elevated ?? Continue IV Ativan 1 to 2 mg for seizures lasting more than 3 minutes or more than 3 seizures in an hour. ?? -Patient alert today, clinically improved. ?? Continue Keppra and Vimpat and transition to oral today.  ?? -Seizure precautions. ?? Per neurology. ?

## 2022-04-15 NOTE — ED Provider Notes (Signed)
?  Physical Exam  ?BP 126/75   Pulse 87   Temp 99.3 ?F (37.4 ?C) (Rectal)   Resp 10   Ht 5\' 6"  (1.676 m)   Wt 67.6 kg   SpO2 100%   BMI 24.05 kg/m?  ? ?Physical Exam ? ?Procedures  ?Procedures ? ?ED Course / MDM  ?  ?Medical Decision Making ?Howard Best is a 71 y.o. male here presenting with seizure.  Patient had 2 seizures prior to arrival and the blood 2 seizures while in the ED.  Previous provider contacted Dr. 73 from neurology who recommend for her to come for EEG and admission to the hospital service.,  Signout pending CT head ? ?5:50 pm ?Patient had another 2 episodes of seizure.  The second episode I was able to witness lasted for about a minute.  I ordered Ativan.  I also consulted Dr. Roda Shutters again.  He recommended fosphenytoin loading and also another gram of Keppra. ? ?6:14 PM ?Hospitalist to admit for multiple episodes of seizures.  Patient is somnolent and has no eye deviation no seizure activity right now.  Patient is also protecting his airway currently ? ?CRITICAL CARE ?Performed by: Roda Shutters ? ? ?Total critical care time: 30 minutes ? ?Critical care time was exclusive of separately billable procedures and treating other patients. ? ?Critical care was necessary to treat or prevent imminent or life-threatening deterioration. ? ?Critical care was time spent personally by me on the following activities: development of treatment plan with patient and/or surrogate as well as nursing, discussions with consultants, evaluation of patient's response to treatment, examination of patient, obtaining history from patient or surrogate, ordering and performing treatments and interventions, ordering and review of laboratory studies, ordering and review of radiographic studies, pulse oximetry and re-evaluation of patient's condition. ? ? ? ?Problems Addressed: ?Seizure [facility]): acute illness or injury ? ?Amount and/or Complexity of Data Reviewed ?Labs: ordered. Decision-making details documented in ED  Course. ?Radiology: ordered and independent interpretation performed. Decision-making details documented in ED Course. ?ECG/medicine tests: ordered and independent interpretation performed. Decision-making details documented in ED Course. ? ?Risk ?Prescription drug management. ?Decision regarding hospitalization. ? ? ? ? ? ? ? ?  ?[facility], MD ?04/15/22 1815 ? ?

## 2022-04-15 NOTE — H&P (Signed)
?History and Physical  ? ? ?Patient: Howard Best TDD:[id number] DOB: February 23, 1951 ?DOA: 04/15/2022 ?DOS: the patient was seen and examined on 04/15/2022 ?PCP: Lucretia Field  ?Patient coming from:  Group home ? ?Chief Complaint:  ?Chief Complaint  ?Patient presents with  ? Seizures  ? ?HPI: Howard Best is a 71 y.o. male with medical history significant of cerebral palsy, wheelchair dependent, baseline cognitive deficits, seizure disorder, who was admitted to the hospital with recurrent seizures.  Patient normally has 1-2 seizures per month which are treated with intranasal diazepam.  Yesterday, staff reportedly noted to seizures after which she was lethargic, which was not out of the ordinary for his usual pattern.  He remained somnolent today and did not want to eat or drink which was out of character for him.  EMS was called and he was noted to have further seizures.  Upon arrival to the emergency room, he had several more seizures in the emergency room, all lasting 1 minute, witnessed by ER staff.  Per EDP, he likely has had a total of 6 seizures today.  He received Keppra load, fosphenytoin load after discussing with neurology.  Plans to transfer to [facility] for further neurology work-up ? ?Review of Systems: unable to review all systems due to the inability of the patient to answer questions. ?Past Medical History:  ?Diagnosis Date  ? Cerebral palsy (HCC)   ? Intellectual disability   ? ?History reviewed. No pertinent surgical history. ?Social History:  has no history on file for tobacco use, alcohol use, and drug use. ? ?No Known Allergies ? ?History reviewed. No pertinent family history. ? ?Prior to Admission medications   ?Medication Sig Start Date End Date Taking? Authorizing Provider  ?acetaminophen (TYLENOL) 500 MG tablet Take 500 mg by mouth every 6 (six) hours as needed for moderate pain.   Yes [provider]  ?chlorhexidine (PERIDEX) 0.12 % solution Use as directed 5 mLs in the mouth  or throat at bedtime.   Yes [provider]  ?Cholecalciferol (VITAMIN D) 50 MCG (2000 UT) CAPS Take 2,000 Units by mouth daily.   Yes [provider]  ?diazePAM (VALTOCO 5 MG DOSE) 5 MG/0.1ML LIQD Place 1 spray into the nose daily as needed (seizure).   Yes [provider]  ?DULoxetine (CYMBALTA) 30 MG capsule Take 30 mg by mouth daily.   Yes [provider]  ?gabapentin (NEURONTIN) 600 MG tablet Take 600 mg by mouth in the morning, at noon, in the evening, and at bedtime.   Yes [provider]  ?ketoconazole (NIZORAL) 2 % shampoo Apply 1 application. topically every Monday, Wednesday, and Friday.   Yes [provider]  ?Lactobacillus (ACIDOPHILUS/BIFIDUS PO) Take 2 capsules by mouth daily.   Yes [provider]  ?lisinopril (ZESTRIL) 10 MG tablet Take 10 mg by mouth every evening.   Yes [provider]  ?polyethylene glycol (MIRALAX / GLYCOLAX) 17 g packet Take 17 g by mouth every other day.   Yes [provider]  ?topiramate (TOPAMAX) 100 MG tablet Take 100 mg by mouth 2 (two) times daily.   Yes [provider]  ? ? ?Physical Exam: ?Vitals:  ? 04/15/22 1745 04/15/22 1756 04/15/22 1815 04/15/22 1850  ?BP: 126/75  131/79 (!) 96/59  ?Pulse: 87  80 89  ?Resp: 10  10 10   ?Temp:      ?TempSrc:      ?SpO2: 100%  100% 98%  ?Weight:  67.6 kg    ?  Height:  5\' 6"  (1.676 m)    ? ?General exam: Lethargic, does not wake up to answer questions ?Respiratory system: Clear to auscultation. Respiratory effort normal. ?Cardiovascular system:RRR. No murmurs, rubs, gallops. ?Gastrointestinal system: Abdomen is nondistended, soft and nontender. No organomegaly or masses felt. Normal bowel sounds heard. ?Central nervous system: Lethargic, does not cooperate with exam, pupils are equal and reactive bilaterally, he does groan to tactile stimulus ?Extremities: Right upper extremity contracture ?Skin: No rashes, lesions or ulcers ?Psychiatry: Unable to  assess ? ?Data Reviewed: ? ?There are no new results to review at this time. ? ?Assessment and Plan: ?* Seizure (HCC) ?Chronically on Topamax as well as gabapentin ?Group home staff reports that he has been compliant with medications  ?no recent changes in meds, no recent fever or other signs of infection ?He reportedly has approximately 1-2 seizures per month which are usually treated with intranasal diazepam ?He follows with a neurologist in [place] as needed.  Staff/family unaware of who exactly he sees ?Had increasing seizures yesterday with approximately 6 seizures today, all lasting approximately 1 minute ?Seizure episodes followed by lethargy ?CT head performed emergency room without acute findings ?Case reviewed with Dr. Waterford with neurology who requested transfer to [facility] for further neuro evaluation ?He is loaded with IV Keppra 2 g and will be continued on 1 g IV every 12 hours ?He also received loading dose of fosphenytoin.  Phenytoin level ordered for a.m. to determine further maintenance dosing ?Holding Topamax and gabapentin for now since he is too lethargic to take these pills by mouth anyways ?Defer further antiepileptics to neurology ?We will also check UA ?Continue Ativan as needed for further seizures ?Seizure precautions ? ?Acute encephalopathy ?Patient lethargic, postictal from seizures.  Also received Ativan during seizure episodes. ?Normally he is awake, usually answers questions with yes and no ?Currently remains lethargic, but does groan to tactile stimulation.  Appears to be protecting airway and vitals are otherwise stable.  Continue to monitor ? ?Goals of care, counseling/discussion ?Discussed CODE STATUS with patient's brother Howard Best who is his co-legal guardian.  He feels it would be appropriate for patient to be DNR during his hospital stay, in case his overall condition declines. ? ?HTN (hypertension) ?Chronically on lisinopril ?Hold for now since blood  pressures were initially soft ? 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 ?Cerebral palsy (HCC) ?Reportedly has cognitive deficits at baseline and mostly answers with yes and no ?Staff at group home reports that patient requires assistance with meals ?He is wheelchair dependent ? ? ? ? ? Advance Care Planning:   Code Status: DNR.  Confirmed with patient's brother Howard Best ? ?Consults: Neurology ? ?Family Communication: Updated patient's brother over the phone ? ?Severity of Illness: ?The appropriate patient status for this patient is INPATIENT. Inpatient status is judged to be reasonable and necessary in order to provide the required intensity of service to ensure the patient's safety. The patient's presenting symptoms, physical exam findings, and initial radiographic and laboratory data in the context of their chronic comorbidities is felt to place them at high risk for further clinical deterioration. Furthermore, it is not anticipated that the patient will be medically stable for discharge from the hospital within 2 midnights of admission.  ? ?* I certify that at the point of admission it is my clinical judgment that the patient will require inpatient hospital care spanning beyond 2 midnights from  the point of admission due to high intensity of service, high risk for further deterioration and high frequency of surveillance required.* ? ?Author: ?Erick BlinksJehanzeb Chase Arnall, MD ?04/15/2022 8:02 PM ? ?For on call review [email or web address].  ?

## 2022-04-15 NOTE — Assessment & Plan Note (Signed)
Chronically wheelchair dependent ?

## 2022-04-15 NOTE — ED Provider Notes (Addendum)
?[facility] DEPT ?Provider Note ? ? ?CSN: [id number] ?Arrival date & time: 04/15/22  1507 ? ?  ? ?History ? ?Chief Complaint  ?Patient presents with  ? Seizures  ? ? ?Howard Best is a 71 y.o. male. ? ?Patient is a 71 year old male with a history of cerebral palsy and intellectual disability as well as seizures who lives in a group home who presents with seizure activity.  His caretaker is at bedside and reports that he has had some worsening seizure activity since yesterday.  He says that normally his seizures just resulted in some twitching but he is awake and alert during the episodes.  These recent seizures he has been jerking harder and has been not as responsive as normal.  He started having a seizure yesterday and had more today.  He is normally verbal but he has not been verbal after the seizures today.  He has been less responsive since about 130 this afternoon.  They report he has been taking his seizure medicines as directed.  He is on Topamax for his seizures.  He had 2 witnessed seizures by EMS.  He was given 5 mg IV midazolam. ? ? ?  ? ?Home Medications ?Prior to Admission medications   ?Medication Sig Start Date End Date Taking? Authorizing Provider  ?oseltamivir (TAMIFLU) 75 MG capsule Take 1 capsule (75 mg total) by mouth every 12 (twelve) hours. 03/22/22   Hendricks Limes, PA-C  ?   ? ?Allergies    ?Patient has no known allergies.   ? ?Review of Systems   ?Review of Systems  ?Unable to perform ROS: Mental status change  ? ?Physical Exam ?Updated Vital Signs ?BP 116/70   Pulse 72   Temp 99.3 ?F (37.4 ?C) (Rectal)   Resp (!) 6   SpO2 100%  ?Physical Exam ?Constitutional:   ?   Appearance: He is well-developed.  ?   Comments: Is responsive only to painful stimuli  ?HENT:  ?   Head: Normocephalic and atraumatic.  ?Eyes:  ?   Pupils: Pupils are equal, round, and reactive to light.  ?Cardiovascular:  ?   Rate and Rhythm: Normal rate and regular rhythm.  ?   Heart  sounds: Normal heart sounds.  ?Pulmonary:  ?   Effort: Pulmonary effort is normal. No respiratory distress.  ?   Breath sounds: Normal breath sounds. No wheezing or rales.  ?Chest:  ?   Chest wall: No tenderness.  ?Abdominal:  ?   General: Bowel sounds are normal.  ?   Palpations: Abdomen is soft.  ?   Tenderness: There is no abdominal tenderness. There is no guarding or rebound.  ?Musculoskeletal:     ?   General: Normal range of motion.  ?   Cervical back: Normal range of motion and neck supple.  ?Lymphadenopathy:  ?   Cervical: No cervical adenopathy.  ?Skin: ?   General: Skin is warm and dry.  ?   Findings: No rash.  ?Neurological:  ?   Comments: Currently only responsive to painful stimuli, he has some contracture of his right side which is chronic per caretaker.  ? ? ?ED Results / Procedures / Treatments   ?Labs ?(all labs ordered are listed, but only abnormal results are displayed) ?Labs Reviewed  ?BASIC METABOLIC PANEL - Abnormal; Notable for the following components:  ?    Result Value  ? Chloride 112 (*)   ? CO2 20 (*)   ? Calcium 8.2 (*)   ?  All other components within normal limits  ?CBC WITH DIFFERENTIAL/PLATELET  ?CBG MONITORING, ED  ? ? ?EKG ?EKG Interpretation ? ?Date/Time:  Friday April 15 2022 15:35:27 EDT ?Ventricular Rate:  75 ?PR Interval:  156 ?QRS Duration: 93 ?QT Interval:  362 ?QTC Calculation: 405 ?R Axis:   0 ?Text Interpretation: Sinus rhythm Abnormal R-wave progression, early transition since last tracing no significant change Confirmed by Malvin Johns [phone]) on 04/15/2022 4:18:29 PM ? ?Radiology ?No results found. ? ?Procedures ?Procedures  ? ? ?Medications Ordered in ED ?Medications  ?sodium chloride 0.9 % bolus 1,000 mL (1,000 mLs Intravenous Bolus 04/15/22 1556)  ?levETIRAcetam (KEPPRA) IVPB 1000 mg/100 mL premix (0 mg Intravenous Stopped 04/15/22 1613)  ? ? ?ED Course/ Medical Decision Making/ A&P ?  ?                        ?Medical Decision Making ?Amount and/or Complexity of Data  Reviewed ?Labs: ordered. ?Radiology: ordered. ? ?Risk ?Prescription drug management. ? ? ?Patient is a 71 year old male who presents after increased seizure activity.  He has had multiple seizures since yesterday per caretaker.  He is also confused during the seizures which is not his baseline seizures.  Labs were obtained which showed no acute abnormality.  He is afebrile with a rectal temp of 99.3.  His vital signs are stable.  Awaiting head CT.  He was given IV Keppra.  Pt turned over to Dr. Darl Householder pending CT and reevaluation.  Will likely need admission. ? ?I did speak with Dr Erlinda Hong who will try to arrange a stat EEG and then decide whether pt can stay at [facility] or will need transfer to [facility].  Recommends to continue [place] for now. ? ? ?Final Clinical Impression(s) / ED Diagnoses ?Final diagnoses:  ?Seizure ([place])  ? ? ?Rx / DC Orders ?ED Discharge Orders   ? ? None  ? ?  ? ? ?  ?Malvin Johns, MD ?04/15/22 1620 ? ?  ?Malvin Johns, MD ?04/15/22 1647 ? ?

## 2022-04-15 NOTE — ED Notes (Signed)
Attempted to call receiving nurse for pt report. Unsuccessful at this time will try again . ?

## 2022-04-15 NOTE — ED Notes (Signed)
Pt sleeping at this time, will arouse to calling his name but falls back to sleep.  ?

## 2022-04-15 NOTE — Assessment & Plan Note (Addendum)
Dr. Kerry Hough discussed CODE STATUS with patient's brother Zaveon Gillen who is his co-legal guardian.  He feels it would be appropriate for patient to be DNR during his hospital stay, in case his overall condition declines. ?

## 2022-04-15 NOTE — ED Notes (Signed)
[facility] at pt beside for [facility] ? ?

## 2022-04-15 NOTE — ED Notes (Signed)
Pt asleep and resting comfortably at this time. Caretaker at bedside ?

## 2022-04-15 NOTE — Assessment & Plan Note (Addendum)
Patient lethargic, postictal from seizures on presentation early on during the hospitalization.  Also received Ativan during seizure episodes. ?Normally he is awake, usually answers questions with yes and no ?Was lethargic/somnolent felt likely secondary to phenytoin as phenytoin level noted to be elevated and likely take a while to be excreted.   Appears to be protecting airway and vitals are otherwise stable.   ?-Patient alert over the past 2 days, somewhat interactive, somewhat following simple commands. ?

## 2022-04-15 NOTE — Assessment & Plan Note (Addendum)
Chronically on lisinopril ?BP soft/borderline on presentation. ?-Resume home regimen lisinopril.  ?Gentle hydration. ?

## 2022-04-15 NOTE — Assessment & Plan Note (Addendum)
?   Reportedly has cognitive deficits at baseline and mostly answers with yes and no ?? Staff at group home reported that patient requires assistance with meals ?? He is wheelchair dependent ?

## 2022-04-15 NOTE — ED Notes (Addendum)
[facility] called for pt transfer to [facility] 

## 2022-04-15 NOTE — ED Triage Notes (Signed)
Patient BIBA from group home d/t seizure activity which has been occurring since yesterday per staff. EMS reports witnessing two seizures which lasted approx 45 sec each. Staff reports he has been taking his seizure medications as normal.  ? ?22 g LH ?IV 5 mg midazolam  ?

## 2022-04-15 NOTE — ED Notes (Signed)
Report given to receiving nurse savannah ?

## 2022-04-16 ENCOUNTER — Inpatient Hospital Stay (HOSPITAL_COMMUNITY): Payer: Medicare Other

## 2022-04-16 DIAGNOSIS — R569 Unspecified convulsions: Secondary | ICD-10-CM

## 2022-04-16 DIAGNOSIS — G809 Cerebral palsy, unspecified: Secondary | ICD-10-CM | POA: Diagnosis not present

## 2022-04-16 DIAGNOSIS — G9340 Encephalopathy, unspecified: Secondary | ICD-10-CM | POA: Diagnosis not present

## 2022-04-16 DIAGNOSIS — M62421 Contracture of muscle, right upper arm: Secondary | ICD-10-CM | POA: Diagnosis not present

## 2022-04-16 DIAGNOSIS — Z993 Dependence on wheelchair: Secondary | ICD-10-CM

## 2022-04-16 LAB — CBC WITH DIFFERENTIAL/PLATELET
Abs Immature Granulocytes: 0.01 10*3/uL (ref 0.00–0.07)
Basophils Absolute: 0 10*3/uL (ref 0.0–0.1)
Basophils Relative: 1 %
Eosinophils Absolute: 0.2 10*3/uL (ref 0.0–0.5)
Eosinophils Relative: 2 %
HCT: 42.8 % (ref 39.0–52.0)
Hemoglobin: 14.1 g/dL (ref 13.0–17.0)
Immature Granulocytes: 0 %
Lymphocytes Relative: 28 %
Lymphs Abs: 1.9 10*3/uL (ref 0.7–4.0)
MCH: 31.7 pg (ref 26.0–34.0)
MCHC: 32.9 g/dL (ref 30.0–36.0)
MCV: 96.2 fL (ref 80.0–100.0)
Monocytes Absolute: 0.8 10*3/uL (ref 0.1–1.0)
Monocytes Relative: 12 %
Neutro Abs: 3.8 10*3/uL (ref 1.7–7.7)
Neutrophils Relative %: 57 %
Platelets: 215 10*3/uL (ref 150–400)
RBC: 4.45 MIL/uL (ref 4.22–5.81)
RDW: 13.6 % (ref 11.5–15.5)
WBC: 6.7 10*3/uL (ref 4.0–10.5)
nRBC: 0 % (ref 0.0–0.2)

## 2022-04-16 LAB — PHENYTOIN LEVEL, TOTAL: Phenytoin Lvl: 21.7 ug/mL — ABNORMAL HIGH (ref 10.0–20.0)

## 2022-04-16 LAB — BASIC METABOLIC PANEL
Anion gap: 8 (ref 5–15)
BUN: 13 mg/dL (ref 8–23)
CO2: 21 mmol/L — ABNORMAL LOW (ref 22–32)
Calcium: 8.7 mg/dL — ABNORMAL LOW (ref 8.9–10.3)
Chloride: 111 mmol/L (ref 98–111)
Creatinine, Ser: 0.94 mg/dL (ref 0.61–1.24)
GFR, Estimated: >60 mL/min (ref >60)
Glucose, Bld: 82 mg/dL (ref 70–99)
Potassium: 3.8 mmol/L (ref 3.5–5.1)
Sodium: 140 mmol/L (ref 135–145)

## 2022-04-16 LAB — MAGNESIUM: Magnesium: 2.1 mg/dL (ref 1.7–2.4)

## 2022-04-16 MED ORDER — CHLORHEXIDINE GLUCONATE 0.12 % MT SOLN
OROMUCOSAL | Status: AC
Start: 2022-04-16 — End: 2022-04-16
  Administered 2022-04-16: 15 mL via OROMUCOSAL
  Filled 2022-04-16: qty 15

## 2022-04-16 MED ORDER — SODIUM CHLORIDE 0.9 % IV SOLN
200.0000 mg | Freq: Once | INTRAVENOUS | Status: AC
  Administered 2022-04-16: 200 mg via INTRAVENOUS
  Filled 2022-04-16: qty 20

## 2022-04-16 MED ORDER — SODIUM CHLORIDE 0.9 % IV SOLN
100.0000 mg | Freq: Two times a day (BID) | INTRAVENOUS | Status: AC
  Administered 2022-04-16 – 2022-04-18 (×5): 100 mg via INTRAVENOUS
  Filled 2022-04-16 (×5): qty 10

## 2022-04-16 MED ORDER — LEVETIRACETAM IN NACL 1500 MG/100ML IV SOLN
1500.0000 mg | Freq: Two times a day (BID) | INTRAVENOUS | Status: AC
  Administered 2022-04-16 – 2022-04-18 (×6): 1500 mg via INTRAVENOUS
  Filled 2022-04-16 (×8): qty 100

## 2022-04-16 MED ORDER — SODIUM CHLORIDE 0.9 % IV SOLN: INTRAVENOUS | Status: DC

## 2022-04-16 NOTE — Progress Notes (Signed)
Routine EEG Report ? ?Howard Best is a 71 y.o. male with a history of seizures who is undergoing an EEG to evaluate for seizures. ? ?Report: This EEG was acquired with electrodes placed according to the International 10-20 electrode system (including Fp1, Fp2, F3, F4, C3, C4, P3, P4, O1, O2, T3, T4, T5, T6, A1, A2, Fz, Cz, Pz). The following electrodes were missing or displaced: none. ? ?The background was composed of alpha frequencies 9-13 Hz with overriding beta. This activity is reactive to stimulation. Sleep was identified by K complexes and sleep spindles. There was no focal slowing. There were no interictal epileptiform discharges. There were no electrographic seizures identified. Photic stimulation and hyperventilation were not performed.  ? ?Impression: This EEG was obtained while awake and asleep and is normal. ?   ?Clinical Correlation: Normal EEGs, however, do not rule out epilepsy. ? ?Su Monks, MD ?[facility] ?[phone] ? ?If 7pm- 7am, please page neurology on call as listed in [place].  ?

## 2022-04-16 NOTE — Plan of Care (Signed)
Neurology Consult Plan of Care ? ?Patient seen for neurology consult earlier today by Dr. Annice Pih. Please see his detailed consult from earlier today for detailed information. ? ?In brief Mr. Howard Best is a 71 y.o. male who presented to [facility] as a transfer from [facility] after having several breakthrough seizures. He has a past medical history significant for cerebral palsy, intellectual disability, seizures on Topamax and gabapentin and typically has 1-2 breakthrough seizures a ?month. He had at least 6 seizures between seizures at home and at [facility] ED and then had 3 back to back seizures after arrival to [facility]. ? ?He was also seen and examined at the bedside today by myself. He remains post ictal and somnolent as when he was seen by Dr. Lorrin Goodell earlier.  ? ?Breakthrough Seizures work up in process including:  ?- Keppra 1500mg  BID ?- Vimpat 200mg  IV load once, Vimpat 100mg  BID ?- fosphenytoin loaded at [facility] ED. Hold off on maintenance fospheny at this time given it would worsen somnolence. Phenytoin levels are pending. ?- Ativan 1-2mg  for seizure lasting more than 3 mins. Or if he has more than 3 seizures in an hour. ?- seizure precautions. ?- Will hold home Topamax and Gabapentin due to inability to take PO at this time. ? ?Bridgett Hattabaugh LRonnald Ramp, AGNP-BC ? ?04/16/2022 / 8:43 AM  ?

## 2022-04-16 NOTE — Progress Notes (Signed)
?PROGRESS NOTE ? ? ? ?Howard Best  CHE:[id number] DOB: 1951/05/09 DOA: 04/15/2022 ?PCP: Trinidad Curet  ? ? ?Chief Complaint  ?Patient presents with  ? Seizures  ? ? ?Brief Narrative:  ?No notes on file  ? ? ?Assessment & Plan: ? Principal Problem: ?  Seizure ([place]) ?Active Problems: ?  Cerebral palsy ([place]) ?  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  Wheelchair dependent ?  HTN (hypertension) ?  Goals of care, counseling/discussion ?  Acute encephalopathy ? ? ? ?Assessment and Plan: ?* Seizure ([place]) ?Chronically on Topamax as well as gabapentin ?Group home staff reports that he has been compliant with medications  ?no recent changes in meds, no recent fever or other signs of infection ?He reportedly has approximately 1-2 seizures per month which are usually treated with intranasal diazepam ?He follows with a neurologist in [place] as needed.  Staff/family unaware of who exactly he sees ?Had increasing seizures yesterday with approximately 6 seizures today, all lasting approximately 1 minute ?Seizure episodes followed by lethargy ?CT head performed emergency room without acute findings ?Case reviewed with Dr. Erlinda Hong with neurology who requested transfer to [facility] for further neuro evaluation ?Patient was loaded with IV Keppra 2 g and will be continued on 1.5 g IV every 12 hours ?He also received loading dose of fosphenytoin.  ?Continue to hold Topamax and gabapentin for now since he is too lethargic to take these pills by mouth anyways ?Patient on continuous EEG. ?-Patient being followed by neurology who recommend holding fosphenytoin at this time as this could worsen patient's somnolence. ?Continue IV Ativan 1 to 2 mg for seizures lasting more than 3 minutes or more than 3 seizures in an hour. ?-Seizure precautions. ?Per neurology. ? ?Acute encephalopathy ?Patient lethargic, postictal from seizures.  Also received Ativan during seizure episodes. ?Normally he is awake, usually answers questions  with yes and no ?Currently remains lethargic/somnolent, but does groan to tactile stimulation.  Appears to be protecting airway and vitals are otherwise stable.  Continue to monitor ? ?Goals of care, counseling/discussion ?Dr. Roderic Palau discussed CODE STATUS with patient's brother Dewon Mendizabal who is his co-legal guardian.  He feels it would be appropriate for patient to be DNR during his hospital stay, in case his overall condition declines. ? ?HTN (hypertension) ?Chronically on lisinopril ?BP soft/borderline. ?-Continue to hold antihypertensive medications. ?Gentle hydration. ? 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 ?Cerebral palsy ([place]) ?Reportedly has cognitive deficits at baseline and mostly answers with yes and no ?Staff at group home reported that patient requires assistance with meals ?He is wheelchair dependent ? ? ? ? ?  ? ? ?DVT prophylaxis: Lovenox ?Code Status: DNR ?Family Communication: No family at bedside ?Disposition: TBD ? ?Status is: Inpatient ?Remains inpatient appropriate because: Severity of illness ?  ?Consultants:  ?Neurology: Dr.Khaliqdina 04/16/2022 ? ?Procedures:  ?CT head 04/15/2022 ? ? ? ?Antimicrobials:  ?None ? ? ?Subjective: ?Laying in fetal position.  Somnolent.  On continuous EEG.  Occasionally yawning. ? ?Objective: ?Vitals:  ? 04/16/22 0551 04/16/22 0906 04/16/22 1100 04/16/22 1544  ?BP: 122/64 113/85 126/71 92/79  ?Pulse: 75 86 81 94  ?Resp: 16 20 20 18   ?Temp:  98.1 ?F (36.7 ?C) 97.8 ?F (36.6 ?C) 97.9 ?F (36.6 ?C)  ?TempSrc:  Oral Oral Oral  ?SpO2: 99% 100% 100% 100%  ?Weight:      ?Height:      ? ? ?  Intake/Output Summary (Last 24 hours) at 04/16/2022 1722 ?Last data filed at 04/16/2022 0300 ?Gross per 24 hour  ?Intake 91.43 ml  ?Output 2 ml  ?Net 89.43 ml  ? ?Filed Weights  ? 04/15/22 1756 04/16/22 0011  ?Weight: 67.6 kg 68.3 kg  ? ? ?Examination: ? ?General exam:  Somnolent. ?Respiratory system: Clear to auscultation. Respiratory effort normal. ?Cardiovascular system: S1 & S2 heard, RRR. No JVD, murmurs, rubs, gallops or clicks. No pedal edema. ?Gastrointestinal system: Abdomen is nondistended, soft and nontender. No organomegaly or masses felt. Normal bowel sounds heard. ?Central nervous system: Somnolent.  In fetal position. ?Extremities: Symmetric 5 x 5 power. ?Skin: No rashes, lesions or ulcers ?Psychiatry: Judgement and insight appear normal. Mood & affect appropriate.  ? ? ? ?Data Reviewed:  ? ?CBC: ?Recent Labs  ?Lab 04/15/22 ?1548 04/16/22 ?1006  ?WBC 7.5 6.7  ?NEUTROABS 5.0 3.8  ?HGB 13.7 14.1  ?HCT 41.1 42.8  ?MCV 96.9 96.2  ?PLT 241 215  ? ? ?Basic Metabolic Panel: ?Recent Labs  ?Lab 04/15/22 ?1548 04/16/22 ?1006  ?NA 138 140  ?K 4.2 3.8  ?CL 112* 111  ?CO2 20* 21*  ?GLUCOSE 81 82  ?BUN 18 13  ?CREATININE 1.00 0.94  ?CALCIUM 8.2* 8.7*  ?MG  --  2.1  ? ? ?GFR: ?Estimated Creatinine Clearance: 67.9 mL/min (by C-G formula based on SCr of 0.94 mg/dL). ? ?Liver Function Tests: ?No results for input(s): AST, ALT, ALKPHOS, BILITOT, PROT, ALBUMIN in the last 168 hours. ? ?CBG: ?Recent Labs  ?Lab 04/15/22 ?1532  ?GLUCAP 92  ? ? ? ?No results found for this or any previous visit (from the past 240 hour(s)).  ? ? ? ? ? ?Radiology Studies: ?CT Head Wo Contrast ? ?Result Date: 04/15/2022 ?CLINICAL DATA:  Altered mental status. EXAM: CT HEAD WITHOUT CONTRAST TECHNIQUE: Contiguous axial images were obtained from the base of the skull through the vertex without intravenous contrast. RADIATION DOSE REDUCTION: This exam was performed according to the departmental dose-optimization program which includes automated exposure control, adjustment of the mA and/or kV according to patient size and/or use of iterative reconstruction technique. COMPARISON:  August 02, 2007. FINDINGS: Brain: No mass effect or midline shift is noted. Severe dilatation of the posterior portions of the lateral  ventricles is noted with overlying cortical atrophy which is unchanged compared to prior exam. No definite hemorrhage, acute infarction or mass lesion is noted. Stable left frontal encephalomalacia is noted. Vascular: No hyperdense vessel or unexpected calcification. Skull: Normal. Negative for fracture or focal lesion. Sinuses/Orbits: No acute finding. Other: None. 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 No acute abnormality is noted. Electronically Signed   By: Marijo Conception M.D.   On: 04/15/2022 16:57   ? ? ? ? ? ?Scheduled Meds: ? chlorhexidine gluconate (MEDLINE KIT)  15 mL Mouth Rinse BID  ? enoxaparin (LOVENOX) injection  40 mg Subcutaneous Q24H  ? mouth rinse  15 mL Mouth Rinse 10 times per day  ? ?Continuous Infusions: ? sodium chloride 75 mL/hr (04/15/22 2053)  ? lacosamide (VIMPAT) IV    ? levETIRAcetam 1,500 mg (04/16/22 1014)  ? ? ? LOS: 1 day  ? ? ?Time spent: 35 minutes ? ? ? ?Irine Seal, MD ?[facility] ? ? ?To contact the attending provider between 7A-7P or the covering provider during after hours 7P-7A, please log into the web site [email or web address] and access using universal Box password for that web site.  If you do not have the password, please call the hospital operator. ? ?04/16/2022, 5:22 PM  ?  ?

## 2022-04-16 NOTE — Progress Notes (Signed)
EEG complete - results pending 

## 2022-04-16 NOTE — Consult Note (Signed)
NEUROLOGY CONSULTATION NOTE  ? ?Date of service: April 16, 2022 ?Patient Name: Howard Best ?MRN:  [id number] ?DOB:  March 01, 1951 ?Reason for consult: "Seizures" ?Requesting Provider: Kathie Dike, MD ?_ _ _   _ __   _ __ _ _  __ __   _ __   __ _ ? ?History of Present Illness  ?Howard Best is a 71 y.o. male with PMH significant for cerebral palsy, intellectual disability, seizures on Topamax and gabapentin and typically has 1-2 breakthrough seizures a month who presented to [facility] after seizures at home. Per notes, had a seizure at home and was somnolent most of the day. Did not take food. EMS called and he had another seizure and had a total of 6 seizures in the day including all the seizure. ? ?He was loaded with Keppra, had a seizure afterwards and was loaded with fosphenytoin. He was transferred to [facility] for further evaluation and workup. ? ?Work-up so far with no fever, vitals are normal.  Labs with no significant electrolyte abnormality.  No leukocytosis.  CT head with stable colpocephaly and severe overlying cortical atrophy that is chronic with no acute intracranial abnormality. ?  ?ROS  ?Unable to obtain 2/2 baseline intellectual disability, post ictal. ? ?Past History  ? ?Past Medical History:  ?Diagnosis Date  ? Cerebral palsy ([place])   ? Intellectual disability   ? ?History reviewed. No pertinent surgical history. ?History reviewed. No pertinent family history. ?Social History  ? ?Socioeconomic History  ? Marital status: Single  ?  Spouse name: Not on file  ? Number of children: Not on file  ? Years of education: Not on file  ? Highest education level: Not on file  ?Occupational History  ? Not on file  ?Tobacco Use  ? Smoking status: Not on file  ? Smokeless tobacco: Not on file  ?Substance and Sexual Activity  ? Alcohol use: Not on file  ? Drug use: Not on file  ? Sexual activity: Not on file  ?Other Topics Concern  ? Not on file  ?Social History Narrative  ? Not on file  ? ?Social  Determinants of Health  ? ?Financial Resource Strain: Not on file  ?Food Insecurity: Not on file  ?Transportation Needs: Not on file  ?Physical Activity: Not on file  ?Stress: Not on file  ?Social Connections: Not on file  ? ?No Known Allergies ? ?Medications  ? ?Medications Prior to Admission  ?Medication Sig Dispense Refill Last Dose  ? acetaminophen (TYLENOL) 500 MG tablet Take 500 mg by mouth every 6 (six) hours as needed for moderate pain.   03/25/2022  ? chlorhexidine (PERIDEX) 0.12 % solution Use as directed 5 mLs in the mouth or throat at bedtime.   04/14/2022  ? Cholecalciferol (VITAMIN D) 50 MCG (2000 UT) CAPS Take 2,000 Units by mouth daily.   04/15/2022  ? diazePAM (VALTOCO 5 MG DOSE) 5 MG/0.1ML LIQD Place 1 spray into the nose daily as needed (seizure).   04/15/2022  ? DULoxetine (CYMBALTA) 30 MG capsule Take 30 mg by mouth daily.   04/15/2022  ? gabapentin (NEURONTIN) 600 MG tablet Take 600 mg by mouth in the morning, at noon, in the evening, and at bedtime.   04/15/2022  ? ketoconazole (NIZORAL) 2 % shampoo Apply 1 application. topically every Monday, Wednesday, and Friday.   04/13/2022  ? Lactobacillus (ACIDOPHILUS/BIFIDUS PO) Take 2 capsules by mouth daily.   04/15/2022  ? lisinopril (ZESTRIL) 10 MG tablet Take 10 mg  by mouth every evening.   04/14/2022  ? polyethylene glycol (MIRALAX / GLYCOLAX) 17 g packet Take 17 g by mouth every other day.   04/15/2022  ? topiramate (TOPAMAX) 100 MG tablet Take 100 mg by mouth 2 (two) times daily.   04/15/2022  ?  ? ?Vitals  ? ?Vitals:  ? 04/15/22 2000 04/15/22 2100 04/16/22 0011 04/16/22 0431  ?BP: 97/66 122/78 112/88 120/64  ?Pulse: 85 89 80 89  ?Resp: 14 14 17 16   ?Temp:  98.7 ?F (37.1 ?C) 97.9 ?F (36.6 ?C) 98 ?F (36.7 ?C)  ?TempSrc:  Oral Oral Oral  ?SpO2: 100% 100% 100% 100%  ?Weight:   68.3 kg   ?Height:   5\' 6"  (1.676 m)   ?  ? ?Body mass index is 24.3 kg/m?. ? ?Physical Exam  ? ?General: Laying comfortably in bed; in no acute distress.  ?HENT: Normal oropharynx and  mucosa. Normal external appearance of ears and nose.  ?Neck: Supple, no pain or tenderness  ?CV: No JVD. No peripheral edema.  ?Pulmonary: Symmetric Chest rise. Normal respiratory effort.  ?Abdomen: Soft to touch, non-tender.  ?Ext: No cyanosis, edema, or deformity  ?Skin: No rash. Normal palpation of skin.   ?Musculoskeletal: Normal digits and nails by inspection. No clubbing.  ? ?Neurologic Examination  ?Mental status/Cognition: somnolent, no response to voice, grimaces to tactile stimulation. Covers his eyes when attempting to open his eyes. ?Speech/language: none, does not follow commands. Does try to push me away. ?Cranial nerves:  ? CN II Pupils equal and reactive to light, eyes closed and unable to assess for VF deficits.  ? CN III,IV,VI EOM intact, no gaze preference or deviation, no nystagmus  ? CN V Closes eyes tight shut on attempted corneal reflex.  ? CN VII Symmetric facial grimace.  ? CN VIII Does not turn head towards speech  ? CN IX & X Unable to assess, seems to be protecting his airway.  ? CN XI Unable to assess.  ? CN XII Unable to assess.  ? ?Motor/sensory:  ?Muscle bulk: poor, tone increased in all extermities with contractures. ?Spontaneously moves BL upper extremities and localizes to proximal pinch in BL lower extremities. ? ?Coordination/Complex Motor:  ?- Unable to assess. ? ?Labs  ? ?CBC:  ?Recent Labs  ?Lab 04/15/22 ?1548  ?WBC 7.5  ?NEUTROABS 5.0  ?HGB 13.7  ?HCT 41.1  ?MCV 96.9  ?PLT 241  ? ? ?Basic Metabolic Panel:  ?Lab Results  ?Component Value Date  ? NA 138 04/15/2022  ? K 4.2 04/15/2022  ? CO2 20 (L) 04/15/2022  ? GLUCOSE 81 04/15/2022  ? BUN 18 04/15/2022  ? CREATININE 1.00 04/15/2022  ? CALCIUM 8.2 (L) 04/15/2022  ? GFRNONAA >60 04/15/2022  ? GFRAA >60 09/10/2020  ? ?Lipid Panel: No results found for: [place] ?HgbA1c: No results found for: HGBA1C ?Urine Drug Screen: No results found for: LABOPIA, COCAINSCRNUR, [place], [place], THCU, LABBARB  ?Alcohol Level No results found  for: ETH ? 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 No acute abnormality is ?noted. ? ?rEEG:  ?pending ? ?Impression  ? ?Howard Best is a 71 y.o. male with PMH significant for cerebral palsy, intellectual disability, seizures on Topamax and gabapentin and typically has 1-2 breakthrough seizures a month who presents today after several seizures. He had atleast 6 seizures between seizures at home and at [facility] ED and then had 3 back to back seizures after arrival  to [facility]. ? ?He is post ictal on evaluation. So far has been loaded with Keppra and with Fosphenytoin at [facility]. He was given 2mg  of Ativan here and loaded with Vimpat. ? ?Unable to take his home PO Topamax and Gabapentin due to post ictal somnolence. ? ?Recommendations  ?- Keppra 1500mg  BID ?- Vipat 200mg  IV load once, Vimpat 100mg  BID ?- fosphenytoin loaded at [facility]. Hold off on maintenance fospheny at this time given it would worsen somnolence. Phenytoin levels are pending. ?- Ativan 1-2mg  for seizure lasting more than 3 mins. Or if he has more than 3 seizures in an hour. ?- seizure precautions. ?- Will hold home Topamax and Gabapentin due to inability to take PO at this time. ?______________________________________________________________________ ? ? ?Thank you for the opportunity to take part in the care of this patient. If you have any further questions, please contact the neurology consultation attending. ? ?Signed, ? ?Donnetta Simpers ?[facility] ?Pager Number [phone] ?_ _ _   _ __   _ __ _ _  __ __   _ __   __ _ ? ?

## 2022-04-16 NOTE — Progress Notes (Signed)
LTM EEG hooked up and running - no initial skin breakdown - push button tested - neuro notified. [facility] monitoring.  

## 2022-04-16 NOTE — Progress Notes (Signed)
Initial Nutrition Assessment ? ?DOCUMENTATION CODES:  ? ?Not applicable ? ?INTERVENTION:  ? ?If unable to safely advance diet within the next 24-48 hours, recommend Cortrak placement for enteral nutrition supplementation if within goals of care. ? ?When able to advance diet, patient would benefit from the addition of PO supplements.  ? ?NUTRITION DIAGNOSIS:  ? ?Inadequate oral intake related to inability to eat as evidenced by NPO status. ? ?GOAL:  ? ?Patient will meet greater than or equal to 90% of their needs ? ?MONITOR:  ? ?Diet advancement, PO intake ? ?REASON FOR ASSESSMENT:  ? ?Malnutrition Screening Tool ?  ? ?ASSESSMENT:  ? ?71 yo male admitted with seizures. PMH includes cerebral palsy, intellectual disability, seizures. ? ?RD working remotely. ?Unable to speak with patient or complete nutrition focused physical exam. ?No answer when room number called.  ? ?Weight history reviewed. ?Weight documented at 81.6 kg on 03/22/22. ?Currently 68.3 kg ?16% weight loss within a month is severe. ? ?Patient is at increased nutrition risk, likely malnourished. ?Unable to obtain enough information at this time for identification of malnutrition.  ? ?Labs and medications reviewed.  ? ?Currently NPO. ?If unable to safely take PO's within the next 24-48 hours, recommend Cortrak placement for enteral nutrition supplementation. ? ?NUTRITION - FOCUSED PHYSICAL EXAM: ? ?Unable to complete ? ?Diet Order:   ?Diet Order   ? ?       ?  Diet NPO time specified  Diet effective now       ?  ? ?  ?  ? ?  ? ? ?EDUCATION NEEDS:  ? ?No education needs have been identified at this time ? ?Skin:  Skin Assessment: Reviewed RN Assessment ? ?Last BM:  4/27 ? ?Height:  ? ?Ht Readings from Last 1 Encounters:  ?04/16/22 5\' 6"  (1.676 m)  ? ? ?Weight:  ? ?Wt Readings from Last 1 Encounters:  ?04/16/22 68.3 kg  ? ? ?Ideal Body Weight:  64.5 kg ? ?BMI:  Body mass index is 24.3 kg/m?. ? ?Estimated Nutritional Needs:  ? ?Kcal:  2000-2200 ? ?Protein:   90-100 gm ? ?Fluid:  2-2.2 L ? ? ? ?Lucas Mallow RD, LDN, CNSC ?Please refer to Amion for contact information.                                                       ? ?

## 2022-04-17 DIAGNOSIS — G809 Cerebral palsy, unspecified: Secondary | ICD-10-CM | POA: Diagnosis not present

## 2022-04-17 DIAGNOSIS — R569 Unspecified convulsions: Secondary | ICD-10-CM | POA: Diagnosis not present

## 2022-04-17 DIAGNOSIS — M62421 Contracture of muscle, right upper arm: Secondary | ICD-10-CM | POA: Diagnosis not present

## 2022-04-17 DIAGNOSIS — G9340 Encephalopathy, unspecified: Secondary | ICD-10-CM | POA: Diagnosis not present

## 2022-04-17 MED ORDER — CHLORHEXIDINE GLUCONATE 0.12 % MT SOLN
OROMUCOSAL | Status: AC
Start: 2022-04-17 — End: 2022-04-17
  Administered 2022-04-17: 15 mL via OROMUCOSAL
  Filled 2022-04-17: qty 15

## 2022-04-17 MED ORDER — LORAZEPAM 2 MG/ML IJ SOLN: 2.0000 mg | Freq: Once | INTRAMUSCULAR | Status: DC | PRN

## 2022-04-17 NOTE — Progress Notes (Signed)
Pt removed from EEG, mo skin breakdown  ?

## 2022-04-17 NOTE — Progress Notes (Signed)
?PROGRESS NOTE ? ? ? ?MERDITH BOYD  VWP:[id number] DOB: 03-Jun-1951 DOA: 04/15/2022 ?PCP: Lucretia Field  ? ? ?Chief Complaint  ?Patient presents with  ? Seizures  ? ? ?Brief Narrative:  ?No notes on file  ? ? ?Assessment & Plan: ? Principal Problem: ?  Seizure (HCC) ?Active Problems: ?  Cerebral palsy (HCC) ?  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  Wheelchair dependent ?  HTN (hypertension) ?  Goals of care, counseling/discussion ?  Acute encephalopathy ? ? ? ?Assessment and Plan: ?* Seizure (HCC) ?Chronically on Topamax as well as gabapentin ?Group home staff reports that he has been compliant with medications  ?no recent changes in meds, no recent fever or other signs of infection ?He reportedly has approximately 1-2 seizures per month which are usually treated with intranasal diazepam ?He follows with a neurologist in [place] as needed.  Staff/family unaware of who exactly he sees ?Had increasing seizures the day prior to admission, with approximately 6 seizures today, all lasting approximately 1 minute ?Seizure episodes followed by lethargy ?CT head performed emergency room without acute findings ?Case reviewed with Dr. Roda Shutters with neurology who requested transfer to [facility] for further neuro evaluation ?Patient was loaded with IV Keppra 2 g and will be continued on 1.5 g IV every 12 hours ?He also received loading dose of fosphenytoin.  ?Continue to hold Topamax and gabapentin for now since he is too lethargic to take these pills by mouth anyways ?Patient on continuous EEG. ?-Patient being followed by neurology who recommend holding fosphenytoin at this time as this could worsen patient's somnolence.  Phenytoin levels noted to be elevated ?Continue IV Ativan 1 to 2 mg for seizures lasting more than 3 minutes or more than 3 seizures in an hour. ?-Seizure precautions. ?Per neurology. ? ?Acute encephalopathy ?Patient lethargic, postictal from seizures.  Also received Ativan during seizure  episodes. ?Normally he is awake, usually answers questions with yes and no ?Currently remains lethargic/somnolent felt likely secondary to phenytoin as phenytoin level noted to be elevated and likely take a while to be excreted.  Neurology, but does groan to tactile stimulation.  Appears to be protecting airway and vitals are otherwise stable.  Continue to monitor ? ?Goals of care, counseling/discussion ?Dr. Kerry Hough discussed CODE STATUS with patient's brother Zair Borawski who is his co-legal guardian.  He feels it would be appropriate for patient to be DNR during his hospital stay, in case his overall condition declines. ? ?HTN (hypertension) ?Chronically on lisinopril ?BP soft/borderline. ?-Continue to hold antihypertensive medications. ?Gentle hydration. ? 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 ?Cerebral palsy (HCC) ?Reportedly has cognitive deficits at baseline and mostly answers with yes and no ?Staff at group home reported that patient requires assistance with meals ?He is wheelchair dependent ? ? ? ? ?  ? ? ?DVT prophylaxis: Lovenox ?Code Status: DNR ?Family Communication: No family at bedside ?Disposition: TBD ? ?Status is: Inpatient ?Remains inpatient appropriate because: Severity of illness ?  ?Consultants:  ?Neurology: Dr.Khaliqdina 04/16/2022 ? ?Procedures:  ?CT head 04/15/2022 ?Continuous EEG ? ? ?Antimicrobials:  ?None ? ? ?Subjective: ?Somnolent, lethargic.  Laying in fetal position.  On continuous EEG.  Intermittent yawning.  ? ?Objective: ?Vitals:  ? 04/17/22 0400 04/17/22 0800 04/17/22 1138 04/17/22 1300  ?BP: 121/80 119/71 121/70   ?Pulse: 98 80 84   ?Resp:  18 16 15   ?Temp: 97.8 ?F (36.6 ?C) )  97.5 ?F (36.4 ?C) 98.4 ?F (36.9 ?C)   ?TempSrc: Oral Axillary Oral   ?SpO2: 98% 100% 100%   ?Weight:      ?Height:      ? ? ?Intake/Output Summary (Last 24 hours) at 04/17/2022 1458 ?Last data filed at  04/16/2022 1930 ?Gross per 24 hour  ?Intake 937.5 ml  ?Output --  ?Net 937.5 ml  ? ?Filed Weights  ? 04/15/22 1756 04/16/22 0011  ?Weight: 67.6 kg 68.3 kg  ? ? ?Examination: ? ?General exam: Somnolent.  Laying in the fetal position. ?Respiratory system: CTA B.  No wheezes, no crackles, no rhonchi.  ?Cardiovascular system: RRR no murmurs rubs or gallops.  No JVD.  No lower extremity edema. ?Gastrointestinal system: Abdomen is nondistended, soft and nontender. No organomegaly or masses felt. Normal bowel sounds heard. ?Central nervous system: Somnolent.  In fetal position. ?Extremities: Symmetric 5 x 5 power. ?Skin: No rashes, lesions or ulcers ?Psychiatry: Judgement and insight appear normal. Mood & affect appropriate.  ? ? ? ?Data Reviewed:  ? ?CBC: ?Recent Labs  ?Lab 04/15/22 ?1548 04/16/22 ?1006  ?WBC 7.5 6.7  ?NEUTROABS 5.0 3.8  ?HGB 13.7 14.1  ?HCT 41.1 42.8  ?MCV 96.9 96.2  ?PLT 241 215  ? ? ?Basic Metabolic Panel: ?Recent Labs  ?Lab 04/15/22 ?1548 04/16/22 ?1006  ?NA 138 140  ?K 4.2 3.8  ?CL 112* 111  ?CO2 20* 21*  ?GLUCOSE 81 82  ?BUN 18 13  ?CREATININE 1.00 0.94  ?CALCIUM 8.2* 8.7*  ?MG  --  2.1  ? ? ?GFR: ?Estimated Creatinine Clearance: 67.9 mL/min (by C-G formula based on SCr of 0.94 mg/dL). ? ?Liver Function Tests: ?No results for input(s): AST, ALT, ALKPHOS, BILITOT, PROT, ALBUMIN in the last 168 hours. ? ?CBG: ?Recent Labs  ?Lab 04/15/22 ?1532  ?GLUCAP 92  ? ? ? ?No results found for this or any previous visit (from the past 240 hour(s)).  ? ? ? ? ? ?Radiology Studies: ?CT Head Wo Contrast ? ?Result Date: 04/15/2022 ?CLINICAL DATA:  Altered mental status. EXAM: CT HEAD WITHOUT CONTRAST TECHNIQUE: Contiguous axial images were obtained from the base of the skull through the vertex without intravenous contrast. RADIATION DOSE REDUCTION: This exam was performed according to the departmental dose-optimization program which includes automated exposure control, adjustment of the mA and/or kV according to  patient size and/or use of iterative reconstruction technique. COMPARISON:  August 02, 2007. FINDINGS: Brain: No mass effect or midline shift is noted. Severe dilatation of the posterior portions of the lateral ventricles is noted with overlying cortical atrophy which is unchanged compared to prior exam. No definite hemorrhage, acute infarction or mass lesion is noted. Stable left frontal encephalomalacia is noted. Vascular: No hyperdense vessel or unexpected calcification. Skull: Normal. Negative for fracture or focal lesion. Sinuses/Orbits: No acute finding. Other: None. 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 No acute abnormality is noted. Electronically Signed   By: Lupita RaiderJames  Green Jr M.D.   On: 04/15/2022 16:57  ? ?Overnight EEG with video ? ?Result Date: 04/17/2022 ?Charlsie QuestYadav, Priyanka O, MD     04/17/2022  9:26 AM Patient Name: Jory Simsndrew Q Boughner MRN: [id number] Epilepsy Attending: Charlsie QuestPriyanka O Yadav Referring Physician/Provider: Gordy CouncilmanBhagat, Srishti L, MD Duration: 04/16/2022 0902 to 04/17/2022 0902 Patient history:  71 y.o. male with PMH significant for cerebral palsy, intellectual disability, seizures on Topamax and gabapentin and typically has 1-2 breakthrough seizures a month who presents today after several seizures. EEG to  evaluate for seizures. Level of alertness: Awake, asleep AEDs during EEG study: LEV, LCM Technical aspects: This EEG study was done with scalp electrodes positioned according to the 10-20 International system of electrode placement. Electrical activity was acquired at a sampling rate of 500Hz  and reviewed with a high frequency filter of 70Hz  and a low frequency filter of 1Hz . EEG data were recorded continuously and digitally stored. Description: No clear posterior dominant rhythm was seen. Sleep was characterized by vertex waves, sleep spindles (12 to 14 Hz), maximal frontocentral region. EEG showed continuous generalized low amplitude  5-6hz  theta slowing admixed with 15 to 18 Hz beta activity distributed symmetrically and diffusely. Hyperventilation and photic stimulation were not performed.   ABNORMALITY - Continuous slow, generalized IMPRES

## 2022-04-17 NOTE — Progress Notes (Addendum)
Neurology Progress Note ?Jeannett Senior ?MR# [id number] ?04/17/2022 ? ? ?S: No nurse reported overnight events or new complaints. Best answers yes and no at baseline but is too lethargic at this time.  ? ? ?O: ?Current vital signs: ?BP 119/71 (BP Location: Left Arm)   Pulse 80   Temp (!) 97.5 ?F (36.4 ?C) (Axillary)   Resp 18   Ht 5' 6"  (1.676 m)   Wt 68.3 kg   SpO2 100%   BMI 24.30 kg/m?  ?Vital signs in last 24 hours: ?Temp:  [97.5 ?F (36.4 ?C)-98.1 ?F (36.7 ?C)] 97.5 ?F (36.4 ?C) (04/30 0800) ?Pulse Rate:  [80-98] 80 (04/30 0800) ?Resp:  [16-20] 18 (04/30 0800) ?BP: (92-126)/(71-85) 119/71 (04/30 0800) ?SpO2:  [98 %-100 %] 100 % (04/30 0800) ?GENERAL: Laying in Howard fetal position (contracted at baseline) on right side with face snuggled into safety mittens, no distress ?HEENT: Normocephalic and atraumatic ?LUNGS: symmetric excursions bilaterally with no audible wheezes. ?ABDOMEN: Soft, nontender, nondistended with normoactive BS ?Ext: warm, well perfused, no edema ?NEURO:  ?Mental Status: lethargic, did not respond to my voice but did respond by grimacing to noxious stimuli ?Language: speech is mute.  At baseline he says yes and no only.  ?PERR. He helped open his eyes and he turned Howard to look at me then closed them back, no facial asymmetry noted when   ?No evidence of tongue atrophy or fibrillations ?Motor: Moves arms/hands spontaneously, contracted legs ?Tone: Tone is increased and Best is contracted. Bulk is poor ?Gait- nonambulatory at baseline ? ? ?Current Facility-Administered Medications:  ?  0.9 %  sodium chloride infusion, , Intravenous, Continuous, Eugenie Filler, MD, Last Rate: 75 mL/hr at 04/17/22 0808, Rate Change at 04/17/22 0808 ?  acetaminophen (TYLENOL) tablet 650 mg, 650 mg, Oral, Q4H PRN **OR** acetaminophen (TYLENOL) suppository 650 mg, 650 mg, Rectal, Q4H PRN, Kathie Dike, MD ?  chlorhexidine gluconate (MEDLINE KIT) (PERIDEX) 0.12 % solution 15 mL, 15 mL, Mouth Rinse,  BID, Memon, Jolaine Artist, MD, 15 mL at 04/17/22 0949 ?  enoxaparin (LOVENOX) injection 40 mg, 40 mg, Subcutaneous, Q24H, Memon, Jehanzeb, MD, 40 mg at 04/16/22 2207 ?  lacosamide (VIMPAT) 100 mg in sodium chloride 0.9 % 25 mL IVPB, 100 mg, Intravenous, Q12H, Donnetta Simpers, MD, Last Rate: 70 mL/hr at 04/17/22 2154, 100 mg at 04/17/22 2154 ?  levETIRAcetam (KEPPRA) IVPB 1500 mg/ 100 mL premix, 1,500 mg, Intravenous, Q12H, Donnetta Simpers, MD, Last Rate: 400 mL/hr at 04/17/22 2030, 1,500 mg at 04/17/22 2030 ?  LORazepam (ATIVAN) injection 4 mg, 4 mg, Intravenous, Q5 Min x 2 PRN, Kathie Dike, MD, 2 mg at 04/16/22 0602 ?  MEDLINE mouth rinse, 15 mL, Mouth Rinse, 10 times per day, Kathie Dike, MD, 15 mL at 04/17/22 1800 ?  ondansetron (ZOFRAN) tablet 4 mg, 4 mg, Oral, Q6H PRN **OR** ondansetron (ZOFRAN) injection 4 mg, 4 mg, Intravenous, Q6H PRN, Kathie Dike, MD ? ? ?Labs ? ?   ?Component Value Date/Time  ? WBC 6.7 04/16/2022 1006  ? RBC 4.45 04/16/2022 1006  ? HGB 14.1 04/16/2022 1006  ? HCT 42.8 04/16/2022 1006  ? PLT 215 04/16/2022 1006  ? MCV 96.2 04/16/2022 1006  ? MCH 31.7 04/16/2022 1006  ? MCHC 32.9 04/16/2022 1006  ? RDW 13.6 04/16/2022 1006  ? LYMPHSABS 1.9 04/16/2022 1006  ? MONOABS 0.8 04/16/2022 1006  ? EOSABS 0.2 04/16/2022 1006  ? BASOSABS 0.0 04/16/2022 1006  ? ? ?   ?Component Value Date/Time  ?  NA 140 04/16/2022 1006  ? K 3.8 04/16/2022 1006  ? CL 111 04/16/2022 1006  ? CO2 21 (L) 04/16/2022 1006  ? GLUCOSE 82 04/16/2022 1006  ? BUN 13 04/16/2022 1006  ? CREATININE 0.94 04/16/2022 1006  ? CALCIUM 8.7 (L) 04/16/2022 1006  ? GFRNONAA >60 04/16/2022 1006  ? GFRAA >60 09/10/2020 1715  ? ? ?No results found for: CHOL, TRIG, HDL, CHOLHDL, VLDL, LDLCALC, LDLDIRECT ? ?Medications: ?Scheduled Meds: ? chlorhexidine gluconate (MEDLINE KIT)  15 mL Mouth Rinse BID  ? enoxaparin (LOVENOX) injection  40 mg Subcutaneous Q24H  ? mouth rinse  15 mL Mouth Rinse 10 times per day  ? ?Continuous Infusions: ?  sodium chloride 75 mL/hr at 04/17/22 0277  ? lacosamide (VIMPAT) IV 100 mg (04/16/22 2222)  ? levETIRAcetam 1,500 mg (04/16/22 2205)  ? ?PRN Meds:.acetaminophen **OR** acetaminophen, LORazepam, ondansetron **OR** ondansetron (ZOFRAN) IV ? ?Imaging ?I have reviewed images in epic and Howard results pertinent to this consultation are: ?CT Head showed No acute abnormality  ?Over night EEG to rule out any further breakthrough seizures showed continuous and generalized slowing and no seizures or epileptiform discharges.  ? ?Assessment:  ?In brief Howard Best is a 71 y.o. male who presented to [facility] as a transfer from [facility] after having several breakthrough seizures. He has a past medical history significant for cerebral palsy, intellectual disability, seizures on Topamax and gabapentin and typically has 1-2 breakthrough seizures a ?month. He had at least 6 seizures between seizures at home and at [facility] ED and then had 3 back to back seizures after arrival to [place]. ? ?Recommendations/Plan: ?Phentoin level - 21.7 and increased. Likely cause of lethargy and will take time to wash out Howard load he was given.  ? ? ? ?Electronically signed by:  ?Parke Poisson, Neurology NP ?Can be reached on Epic Secure Messenger ?04/17/2022, 8:28 AM ? ?If 7pm- 7am, please page neurology on call as listed in [place].  ? ?Attending Neurologist's note: ? ?On my later evaluation at 10 PM, Howard Best is sleeping but easily awakens. ? ?He is able to tell me that he "wants his people," and that he wants to go home.  He complains about some chest pain but then does not answer further questions about it.  He is able to drink water, though he does not hold Howard cup himself and requires examiners help to drink, with some water spilling out of his mouth ?Cranial nerves: Pupils equal round reactive to light, tracks examiner in all directions, orients to stimuli in all visual fields, face symmetric, tongue midline ?Sensory/motor:  Moves bilateral upper extremities freely antigravity.  He does have spastic contractures in all 4 extremities.  He is equally responsive to light touch in all 4 extremities ? ?Per nurse who has had him multiple nights in a row, this is Howard most commonly interactive that he has been; she describes some agitated delirium behavior Howard night before with combativeness ? ?He has not had a clear trigger for his increased seizure frequency.  Overall I suspect that he had a prolonged postictal state and took some time to clear fosphenytoin that was loaded in [place] ED.  Potentially he was also adjusting to change in seizure medications.  Given that he appears well controlled on Keppra and lacosamide at this time, I favor continuing him on these medications rather than adding back his home medications which are only available in oral formulation (which he could not receive earlier due  to sedation).  Additionally I would like to rule out any new structural process. ? ?Recommendations: ?-Continue Keppra 1500 mg twice daily, may convert to p.o. 1:1 tomorrow ?-Continue Vimpat 100 mg twice daily, may convert to p.o. 1:1 tomorrow ?-Would not resume gabapentin or topiramate at discharge ?-MRI brain to rule out any acute intracranial process, if this is negative and Best is confirmed to be at his baseline in Howard morning, he may be discharged with outpatient neurology follow-up ? ?I personally saw this Best, gathering history, performing Howard examination, reviewing relevant labs, personally reviewing relevant imaging, and formulated Howard assessment and plan, adding Howard note above for completeness and clarity to accurately reflect my thoughts ? ?

## 2022-04-17 NOTE — Procedures (Signed)
Patient Name: Howard Best  ?MRN: [id number]  ?Epilepsy Attending: Charlsie Quest  ?Referring Physician/Provider: Gordy Councilman, MD ?Duration: 04/16/2022 1062 to 04/17/2022 6948 ? ?Patient history:  71 y.o. male with PMH significant for cerebral palsy, intellectual disability, seizures on Topamax and gabapentin and typically has 1-2 breakthrough seizures a month who presents today after several seizures. EEG to evaluate for seizures. ? ?Level of alertness: Awake, asleep ? ?AEDs during EEG study: LEV, LCM ? ?Technical aspects: This EEG study was done with scalp electrodes positioned according to the 10-20 International system of electrode placement. Electrical activity was acquired at a sampling rate of 500Hz  and reviewed with a high frequency filter of 70Hz  and a low frequency filter of 1Hz . EEG data were recorded continuously and digitally stored.  ? ?Description: No clear posterior dominant rhythm was seen. Sleep was characterized by vertex waves, sleep spindles (12 to 14 Hz), maximal frontocentral region. EEG showed continuous generalized low amplitude 5-6hz  theta slowing admixed with 15 to 18 Hz beta activity distributed symmetrically and diffusely. Hyperventilation and photic stimulation were not performed.    ? ?ABNORMALITY ?- Continuous slow, generalized ? ?IMPRESSION: ?This study is suggestive of moderate diffuse encephalopathy, nonspecific etiology. No seizures or epileptiform discharges were seen throughout the recording. ? ?  ? ?

## 2022-04-18 ENCOUNTER — Other Ambulatory Visit (HOSPITAL_COMMUNITY): Payer: Self-pay

## 2022-04-18 DIAGNOSIS — E876 Hypokalemia: Secondary | ICD-10-CM | POA: Clinically undetermined

## 2022-04-18 DIAGNOSIS — G9340 Encephalopathy, unspecified: Secondary | ICD-10-CM | POA: Diagnosis not present

## 2022-04-18 DIAGNOSIS — I10 Essential (primary) hypertension: Secondary | ICD-10-CM | POA: Diagnosis not present

## 2022-04-18 DIAGNOSIS — G809 Cerebral palsy, unspecified: Secondary | ICD-10-CM | POA: Diagnosis not present

## 2022-04-18 DIAGNOSIS — M62421 Contracture of muscle, right upper arm: Secondary | ICD-10-CM | POA: Diagnosis not present

## 2022-04-18 DIAGNOSIS — R569 Unspecified convulsions: Secondary | ICD-10-CM | POA: Diagnosis not present

## 2022-04-18 LAB — BASIC METABOLIC PANEL
Anion gap: 12 (ref 5–15)
BUN: 6 mg/dL — ABNORMAL LOW (ref 8–23)
CO2: 19 mmol/L — ABNORMAL LOW (ref 22–32)
Calcium: 8.4 mg/dL — ABNORMAL LOW (ref 8.9–10.3)
Chloride: 109 mmol/L (ref 98–111)
Creatinine, Ser: 0.82 mg/dL (ref 0.61–1.24)
GFR, Estimated: >60 mL/min (ref >60)
Glucose, Bld: 79 mg/dL (ref 70–99)
Potassium: 3.3 mmol/L — ABNORMAL LOW (ref 3.5–5.1)
Sodium: 140 mmol/L (ref 135–145)

## 2022-04-18 LAB — CBC WITH DIFFERENTIAL/PLATELET
Abs Immature Granulocytes: 0.04 10*3/uL (ref 0.00–0.07)
Basophils Absolute: 0.1 10*3/uL (ref 0.0–0.1)
Basophils Relative: 1 %
Eosinophils Absolute: 0.2 10*3/uL (ref 0.0–0.5)
Eosinophils Relative: 2 %
HCT: 42.1 % (ref 39.0–52.0)
Hemoglobin: 13.9 g/dL (ref 13.0–17.0)
Immature Granulocytes: 1 %
Lymphocytes Relative: 28 %
Lymphs Abs: 2.1 10*3/uL (ref 0.7–4.0)
MCH: 31.4 pg (ref 26.0–34.0)
MCHC: 33 g/dL (ref 30.0–36.0)
MCV: 95 fL (ref 80.0–100.0)
Monocytes Absolute: 1.1 10*3/uL — ABNORMAL HIGH (ref 0.1–1.0)
Monocytes Relative: 15 %
Neutro Abs: 3.9 10*3/uL (ref 1.7–7.7)
Neutrophils Relative %: 53 %
Platelets: 218 10*3/uL (ref 150–400)
RBC: 4.43 MIL/uL (ref 4.22–5.81)
RDW: 13.6 % (ref 11.5–15.5)
WBC: 7.3 10*3/uL (ref 4.0–10.5)
nRBC: 0 % (ref 0.0–0.2)

## 2022-04-18 MED ORDER — POTASSIUM CHLORIDE 10 MEQ/100ML IV SOLN
10.0000 meq | INTRAVENOUS | Status: AC
  Administered 2022-04-18 (×4): 10 meq via INTRAVENOUS
  Filled 2022-04-18 (×4): qty 100

## 2022-04-18 MED ORDER — LEVETIRACETAM 100 MG/ML PO SOLN
1500.0000 mg | Freq: Two times a day (BID) | ORAL | Status: DC
  Administered 2022-04-19 – 2022-04-21 (×5): 1500 mg via ORAL
  Filled 2022-04-18 (×5): qty 15

## 2022-04-18 MED ORDER — LACOSAMIDE 10 MG/ML PO SOLN
100.0000 mg | Freq: Two times a day (BID) | ORAL | Status: DC
  Administered 2022-04-19 – 2022-04-21 (×5): 100 mg via ORAL
  Filled 2022-04-18 (×5): qty 10

## 2022-04-18 NOTE — Progress Notes (Addendum)
?PROGRESS NOTE ? ? ? ?Howard Best  EZM:[id number] DOB: Oct 16, 1951 DOA: 04/15/2022 ?PCP: Trinidad Curet  ? ? ?Chief Complaint  ?Patient presents with  ? Seizures  ? ? ?Brief Narrative:  ?No notes on file  ? ? ?Assessment & Plan: ? Principal Problem: ?  Seizure ([place]) ?Active Problems: ?  Cerebral palsy ([place]) ?  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  Wheelchair dependent ?  HTN (hypertension) ?  Goals of care, counseling/discussion ?  Acute encephalopathy ?  Hypokalemia ? ? ? ?Assessment and Plan: ?* Seizure ([place]) ?Chronically on Topamax as well as gabapentin ?Group home staff reports that he has been compliant with medications  ?no recent changes in meds, no recent fever or other signs of infection ?He reportedly has approximately 1-2 seizures per month which are usually treated with intranasal diazepam ?He follows with a neurologist in [place] as needed.  Staff/family unaware of who exactly he sees ?Had increasing seizures the day prior to admission, with approximately 6 seizures today, all lasting approximately 1 minute ?Seizure episodes followed by lethargy ?CT head performed emergency room without acute findings ?Case reviewed with Dr. Erlinda Hong with neurology who requested transfer to [facility] for further neuro evaluation ?Patient was loaded with IV Keppra 2 g and will be continued on 1.5 g IV every 12 hours ?-Patient also on Vimpat 100 mg twice daily which she seems to be tolerating. ?He also received loading dose of fosphenytoin.  ?Continue to hold Topamax and gabapentin for now and likely will not resume on discharge per neurology recommendations.   ?Patient on continuous EEG which showed moderate diffuse encephalopathy, nonspecific etiology, no seizures or epileptiform discharges were seen throughout the recording. ?Continuous EEG discontinued.. ?-Patient being followed by neurology who recommend holding fosphenytoin at this time as this could worsen patient's somnolence.  Phenytoin levels  noted to be elevated ?Continue IV Ativan 1 to 2 mg for seizures lasting more than 3 minutes or more than 3 seizures in an hour. ?-Patient alert today, clinically improved. ?Continue Keppra and Vimpat and transition to oral when taking p.o.'s. ?-Seizure precautions. ?Per neurology. ? ?Hypokalemia ?KCl 10 mEq IV every hour x4 runs. ?Repeat labs in the morning ? ?Acute encephalopathy ?Patient lethargic, postictal from seizures on presentation early on during the hospitalization.  Also received Ativan during seizure episodes. ?Normally he is awake, usually answers questions with yes and no ?Was lethargic/somnolent felt likely secondary to phenytoin as phenytoin level noted to be elevated and likely take a while to be excreted.   Appears to be protecting airway and vitals are otherwise stable.   ?-Patient more alert today, somewhat interactive, following some simple commands. ? ?Goals of care, counseling/discussion ?Dr. Roderic Palau discussed CODE STATUS with patient's brother Lional Icenogle who is his co-legal guardian.  He feels it would be appropriate for patient to be DNR during his hospital stay, in case his overall condition declines. ? ?HTN (hypertension) ?Chronically on lisinopril ?BP soft/borderline on presentation. ?-Continue to hold antihypertensive medications and resume in the next 24 hours. ?Gentle hydration. ? 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 ?Cerebral palsy ([place]) ?Reportedly has cognitive deficits at baseline and mostly answers with yes and no ?Staff at group home reported that patient requires assistance with meals ?He is wheelchair dependent ? ? ? ? ?  ? ? ?DVT prophylaxis: Lovenox ?Code Status: DNR ?Family Communication: No family at bedside ?Disposition: Likely back to  group home. ? ?Status is: Inpatient ?Remains inpatient appropriate because: Severity of illness ?  ?Consultants:  ?Neurology:  Dr.Khaliqdina 04/16/2022 ? ?Procedures:  ?CT head 04/15/2022 ?Continuous EEG ? ? ?Antimicrobials:  ?None ? ? ?Subjective: ?Alert, ff some commands, moaning. Somnolent, lethargic.  Laying in fetal position.  On continuous EEG.  Intermittent yawning.  ? ?Objective: ?Vitals:  ? 04/18/22 0410 04/18/22 0836 04/18/22 1239 04/18/22 1550  ?BP: 132/88 139/86 (!) 150/78 (!) 148/78  ?Pulse: 97 89 82 86  ?Resp: 18   18  ?Temp: 97.6 ?F (36.4 ?C) 97.8 ?F (36.6 ?C) 97.7 ?F (36.5 ?C) 98.2 ?F (36.8 ?C)  ?TempSrc: Oral Oral Oral Oral  ?SpO2: 99% 100% 100% 99%  ?Weight:      ?Height:      ? ? ?Intake/Output Summary (Last 24 hours) at 04/18/2022 1815 ?Last data filed at 04/18/2022 1709 ?Gross per 24 hour  ?Intake 370 ml  ?Output 1750 ml  ?Net -1380 ml  ? ?Filed Weights  ? 04/15/22 1756 04/16/22 0011  ?Weight: 67.6 kg 68.3 kg  ? ? ?Examination: ? ?General exam: Alert.  Laying in the fetal position.  ?Respiratory system: Lungs clear to auscultation bilaterally.  No wheezes, no crackles, no rhonchi.  Normal respiratory effort.   ?Cardiovascular system: Regular rate rhythm no murmurs rubs or gallops.  No JVD.  No lower extremity edema ?Gastrointestinal system: Abdomen is nondistended, soft and nontender. No organomegaly or masses felt. Normal bowel sounds heard. ?Central nervous system: Alert.  Laying in fetal position.bilateral lower extremities contracted.  Moving upper extremities spontaneously.   ?Extremities: Bilateral lower extremities contracted ?Skin: No rashes, lesions or ulcers ?Psychiatry: Judgement and insight appear poor. Mood & affect appropriate.  ? ? ? ?Data Reviewed:  ? ?CBC: ?Recent Labs  ?Lab 04/15/22 ?1548 04/16/22 ?1006 04/18/22 ?0117  ?WBC 7.5 6.7 7.3  ?NEUTROABS 5.0 3.8 3.9  ?HGB 13.7 14.1 13.9  ?HCT 41.1 42.8 42.1  ?MCV 96.9 96.2 95.0  ?PLT 241 215 218  ? ? ?Basic Metabolic Panel: ?Recent Labs  ?Lab 04/15/22 ?1548 04/16/22 ?1006 04/18/22 ?0117  ?NA 138 140 140  ?K 4.2 3.8 3.3*  ?CL 112* 111 109  ?CO2 20* 21* 19*  ?GLUCOSE  81 82 79  ?BUN 18 13 6*  ?CREATININE 1.00 0.94 0.82  ?CALCIUM 8.2* 8.7* 8.4*  ?MG  --  2.1  --   ? ? ?GFR: ?Estimated Creatinine Clearance: 77.8 mL/min (by C-G formula based on SCr of 0.82 mg/dL). ? ?Liver Function Tests: ?No results for input(s): AST, ALT, ALKPHOS, BILITOT, PROT, ALBUMIN in the last 168 hours. ? ?CBG: ?Recent Labs  ?Lab 04/15/22 ?1532  ?GLUCAP 92  ? ? ? ?No results found for this or any previous visit (from the past 240 hour(s)).  ? ? ? ? ? ?Radiology Studies: ?Overnight EEG with video ? ?Result Date: 04/17/2022 ?Lora Havens, MD     04/17/2022  9:26 AM Patient Name: Howard Best MRN: [id number] Epilepsy Attending: Lora Havens Referring Physician/Provider: Lorenza Chick, MD Duration: 04/16/2022 0902 to 04/17/2022 0902 Patient history:  71 y.o. male with PMH significant for cerebral palsy, intellectual disability, seizures on Topamax and gabapentin and typically has 1-2 breakthrough seizures a month who presents today after several seizures. EEG to evaluate for seizures. Level of alertness: Awake, asleep AEDs during EEG study: LEV, LCM Technical aspects: This EEG study was done with scalp electrodes positioned according to the 10-20 International system of electrode placement. Electrical activity was acquired at a sampling  rate of 500Hz and reviewed with a high frequency filter of 70Hz and a low frequency filter of 1Hz. EEG data were recorded continuously and digitally stored. Description: No clear posterior dominant rhythm was seen. Sleep was characterized by vertex waves, sleep spindles (12 to 14 Hz), maximal frontocentral region. EEG showed continuous generalized low amplitude 5-6hz theta slowing admixed with 15 to 18 Hz beta activity distributed symmetrically and diffusely. Hyperventilation and photic stimulation were not performed.   ABNORMALITY - Continuous slow, generalized IMPRESSION: This study is suggestive of moderate diffuse encephalopathy, nonspecific etiology. No seizures  or epileptiform discharges were seen throughout the recording. Priyanka Barbra Sarks   ? ? ? ? ? ?Scheduled Meds: ? chlorhexidine gluconate (MEDLINE KIT)  15 mL Mouth Rinse BID  ? enoxaparin (LOVENOX) injection

## 2022-04-18 NOTE — Progress Notes (Addendum)
Neurology Progress Note ? ? ?S:// ?Patient laying on right side in fetal position with mittens on. RN at bedside. No new neurological events over night. Patient did become aggressive and mad overnight and refusing lab draws this am. Patient will not speak to me this am ? ? ?O:// ?Current vital signs: ?BP 139/86 (BP Location: Right Arm)   Pulse 89   Temp 97.8 ?F (36.6 ?C) (Oral)   Resp 18   Ht 5' 6"  (1.676 m)   Wt 68.3 kg   SpO2 100%   BMI 24.30 kg/m?  ?Vital signs in last 24 hours: ?Temp:  [97.6 ?F (36.4 ?C)-98.7 ?F (37.1 ?C)] 97.8 ?F (36.6 ?C) (05/01 5726) ?Pulse Rate:  [84-97] 89 (05/01 0836) ?Resp:  [15-19] 18 (05/01 0410) ?BP: (121-139)/(62-88) 139/86 (05/01 0836) ?SpO2:  [97 %-100 %] 100 % (05/01 0836) ? ?GENERAL: Awake, alert in NAD ?HEENT: - Normocephalic and atraumatic, dry mm ?LUNGS - Clear to auscultation bilaterally with no wheezes ?CV - S1S2 RRR, no m/r/g, equal pulses bilaterally. ?ABDOMEN - Soft, nontender, nondistended with normoactive BS ?Ext: warm, well perfused, intact peripheral pulses, no  edema ?NEURO:  ?Mental Status:eyes open, no verbal output, grimaces and withdraws to painful stimuli  ?Language: speech is mute.  Baseline says yes and no  ?Cranial Nerves: PERRL 45m/brisk. EOMI, visual fields full, no facial asymmetry, facial sensation intact, hearing intact,  No evidence of tongue atrophy or fibrillations ?Motor: bilateral legs are contracted. Moves arms spontaneously ?Tone: is increased  ?Sensation- Intact to light touch bilaterally ?Coordination: unable to assess  ?Gait- deferred ? ?Medications ? ?Current Facility-Administered Medications:  ?  0.9 %  sodium chloride infusion, , Intravenous, Continuous, TEugenie Filler MD, Last Rate: 75 mL/hr at 04/17/22 0808, Rate Change at 04/17/22 0808 ?  acetaminophen (TYLENOL) tablet 650 mg, 650 mg, Oral, Q4H PRN **OR** acetaminophen (TYLENOL) suppository 650 mg, 650 mg, Rectal, Q4H PRN, MKathie Dike MD ?  chlorhexidine gluconate (MEDLINE  KIT) (PERIDEX) 0.12 % solution 15 mL, 15 mL, Mouth Rinse, BID, Memon, JJolaine Artist MD, 15 mL at 04/18/22 0836 ?  enoxaparin (LOVENOX) injection 40 mg, 40 mg, Subcutaneous, Q24H, Memon, Jehanzeb, MD, 40 mg at 04/17/22 2244 ?  lacosamide (VIMPAT) 100 mg in sodium chloride 0.9 % 25 mL IVPB, 100 mg, Intravenous, Q12H, KDonnetta Simpers MD, Last Rate: 70 mL/hr at 04/17/22 2154, 100 mg at 04/17/22 2154 ?  levETIRAcetam (KEPPRA) IVPB 1500 mg/ 100 mL premix, 1,500 mg, Intravenous, Q12H, KDonnetta Simpers MD, Last Rate: 400 mL/hr at 04/18/22 0836, 1,500 mg at 04/18/22 0836 ?  LORazepam (ATIVAN) injection 2 mg, 2 mg, Intravenous, Once PRN, Bhagat, Srishti L, MD ?  LORazepam (ATIVAN) injection 4 mg, 4 mg, Intravenous, Q5 Min x 2 PRN, MKathie Dike MD, 2 mg at 04/16/22 0602 ?  MEDLINE mouth rinse, 15 mL, Mouth Rinse, 10 times per day, MKathie Dike MD, 15 mL at 04/17/22 2243 ?  ondansetron (ZOFRAN) tablet 4 mg, 4 mg, Oral, Q6H PRN **OR** ondansetron (ZOFRAN) injection 4 mg, 4 mg, Intravenous, Q6H PRN, Memon, Jehanzeb, MD ?  potassium chloride 10 mEq in 100 mL IVPB, 10 mEq, Intravenous, Q1 Hr x 4, TEugenie Filler MD ? ? ? ?Imaging ?I have reviewed images in epic and the results pertinent to this consultation are: ? 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 No acute abnormality is noted. ? ?EEG ?This study is suggestive of moderate diffuse encephalopathy, nonspecific etiology. No seizures or  epileptiform discharges were seen throughout the recording. ? ?Assessment:  ?Mr. Mussa Groesbeck is a 71 y.o. male who presented to [facility] as a transfer from [facility] after having several breakthrough seizures. He has a past medical history significant for cerebral palsy, intellectual disability, seizures on Topamax and gabapentin and typically has 1-2 breakthrough seizures a ?month. He had at least 6 seizures between seizures at  home and at [facility] ED and then had 3 back to back seizures after arrival to [place]. ?  ?Recommendations: ?-Continue Keppra 1500 mg twice daily- can change to PO when patient taking POs  ?-Continue Vimpat 100 mg twice daily- can change to PO when patient taking PO  ?-Would not resume gabapentin or topiramate at discharge ?- neurology will continue to follow ? ?Beulah Gandy DNP, ACNPC, AG  ?. ?I have seen the patient and reviewed the above note.  He is more interactive with me, is able to tell me his name though gives me the month is August.  He is not oriented to situation. ? ?At this point, I think that an MRI is relatively low yield, and I think he will be very difficult to get to cooperate with it and therefore feels that the risk of heavy sedation, etc. that would be needed is not justified by the expected yield. ? ?He seems to be tolerating both Keppra and Vimpat well, and this can be continued at discharge. ? ?Continue Keppra 1500 mg twice daily ?Continue Vimpat 100 twice daily. ? ?Roland Rack, MD ?[facility] ?[phone] ? ?If 7pm- 7am, please page neurology on call as listed in [place]. ? ? ? ? ? ?

## 2022-04-18 NOTE — Procedures (Signed)
Patient Name: Howard Best  ?MRN: [id number]  ?Epilepsy Attending: Charlsie Quest  ?Referring Physician/Provider: Gordy Councilman, MD ?Duration: 04/17/2022 0902 to 04/17/2022 1721 ?  ?Patient history:  71 y.o. male with PMH significant for cerebral palsy, intellectual disability, seizures on Topamax and gabapentin and typically has 1-2 breakthrough seizures a month who presents today after several seizures. EEG to evaluate for seizures. ?  ?Level of alertness: Awake, asleep ?  ?AEDs during EEG study: LEV, LCM ?  ?Technical aspects: This EEG study was done with scalp electrodes positioned according to the 10-20 International system of electrode placement. Electrical activity was acquired at a sampling rate of 500Hz  and reviewed with a high frequency filter of 70Hz  and a low frequency filter of 1Hz . EEG data were recorded continuously and digitally stored.  ?  ?Description: No clear posterior dominant rhythm was seen. Sleep was characterized by vertex waves, sleep spindles (12 to 14 Hz), maximal frontocentral region. EEG showed continuous generalized low amplitude 5-6hz  theta slowing admixed with 15 to 18 Hz beta activity distributed symmetrically and diffusely. Hyperventilation and photic stimulation were not performed.    ?  ?ABNORMALITY ?- Continuous slow, generalized ?  ?IMPRESSION: ?This study is suggestive of moderate diffuse encephalopathy, nonspecific etiology. No seizures or epileptiform discharges were seen throughout the recording. ?  ?  ?

## 2022-04-18 NOTE — Evaluation (Signed)
Clinical/Bedside Swallow Evaluation ?Patient Details  ?Name: Howard Best ?MRN: [id number] ?Date of Birth: Nov 09, 1951 ? ?Today's Date: 04/18/2022 ?Time: SLP Start Time (ACUTE ONLY): 1210 SLP Stop Time (ACUTE ONLY): 1230 ?SLP Time Calculation (min) (ACUTE ONLY): 20 min ? ?Past Medical History:  ?Past Medical History:  ?Diagnosis Date  ? Cerebral palsy (HCC)   ? Intellectual disability   ? ?Past Surgical History: History reviewed. No pertinent surgical history. ?HPI:  ?71 y.o. male with PMH significant for cerebral palsy, intellectual disability, seizures on Topamax and gabapentin and typically has 1-2 breakthrough seizures a month who presents after several seizures. Per group home manager, pt usually eats his meals from a wheelchair; he is on a chopped diet with thin liquids at baseline; meds are crushed and he requires full assistance with feeding.  ?  ?Assessment / Plan / Recommendation  ?Clinical Impression ? Pt presents with baseline oropharyngeal function based on conversation with group home manager, Hans Eden.  He required total assist with feeding due to positioning in bed.  He was able to convey basic foods he enjoys eating and made his needs known.  Oral phase was marked by by adequate oral control/preparation of purees with good lip seal.  He stated he does not use straws; was able to seal lips around cup edge and drink thin liquids with assist.  There were no concerns for aspiration.  Recommend advancing diet to dysphagia 2/chopped; thin liquids; crush meds in pudding.  Please provide total assist for feeding. There are no further SLP needs. Our service will sign off. D/W RN. ?SLP Visit Diagnosis: Dysphagia, unspecified (R13.10) ?   ?Aspiration Risk ? No limitations  ?  ?Diet Recommendation   Chopped /dysphagia 2; thin liquids ? ?Medication Administration: Crushed with puree  ?  ?Other  Recommendations Oral Care Recommendations: Oral care BID   ? ?Recommendations for follow up therapy are one  component of a multi-disciplinary discharge planning process, led by the attending physician.  Recommendations may be updated based on patient status, additional functional criteria and insurance authorization. ? ?Follow up Recommendations No SLP follow up  ? ? ?  ?Assistance Recommended at Discharge Frequent or constant Supervision/Assistance  ?Functional Status Assessment    ?Frequency and Duration    ?  ?  ?   ? ?Prognosis    ? ?  ? ?Swallow Study   ?General Date of Onset: 04/16/22 ?HPI: 71 y.o. male with PMH significant for cerebral palsy, intellectual disability, seizures on Topamax and gabapentin and typically has 1-2 breakthrough seizures a month who presents after several seizures. Per group home manager, pt usually eats his meals from a wheelchair; he is on a chopped diet with thin liquids at baseline; meds are crushed and he requires full assistance with feeding. ?Type of Study: Bedside Swallow Evaluation ?Previous Swallow Assessment: no ?Diet Prior to this Study: NPO ?Temperature Spikes Noted: No ?Respiratory Status: Room air ?History of Recent Intubation: No ?Behavior/Cognition: Alert;Cooperative;Pleasant mood ?Oral Cavity Assessment: Within Functional Limits ?Oral Care Completed by SLP: No ?Oral Cavity - Dentition: Missing dentition ?Self-Feeding Abilities: Needs assist ?Patient Positioning: Upright in bed ?Baseline Vocal Quality: Normal ?Volitional Cough: Strong ?Volitional Swallow: Able to elicit  ?  ?Oral/Motor/Sensory Function Overall Oral Motor/Sensory Function: Within functional limits   ?Ice Chips     ?Thin Liquid Thin Liquid: Within functional limits  ?  ?Nectar Thick Nectar Thick Liquid: Not tested   ?Honey Thick Honey Thick Liquid: Not tested   ?Puree Puree: Within functional limits   ?  Solid ? ? ?  Solid: Not tested  ? ?  ? ?Blenda Mounts Laurice ?04/18/2022,1:33 PM ? ?Raja Caputi L. Temprence Rhines, MA CCC/SLP ?Acute Rehabilitation Services ?Office number [phone] ?Pager [phone] ? ? ?

## 2022-04-18 NOTE — Care Management Important Message (Signed)
Important Message ? ?Patient Details  ?Name: Howard Best ?MRN: [id number] ?Date of Birth: 02/20/1951 ? ? ?Medicare Important Message Given:  Yes ? ? ? ? ?[name] ?04/18/2022, 4:00 PM ?

## 2022-04-18 NOTE — Progress Notes (Signed)
Patient was very upset, trying to hit staff, kicking on side rails- hurting him self. Md Janee Morn made aware of it. Soft restraint applied. ?

## 2022-04-18 NOTE — TOC Initial Note (Signed)
Transition of Care (TOC) - Initial/Assessment Note  ? ? ?Patient Details  ?Name: Howard Best ?MRN: [id number] ?Date of Birth: Sep 07, 1951 ? ?Transition of Care (TOC) CM/SW Contact:    ?Kermit Balo, RN ?Phone Number: ?04/18/2022, 2:29 PM ? ?Clinical Narrative:                 ?Patient is from a group home: [facility]. CM talked to patients brother and he is in agreement with patient returning to [facility] if they accept. CM has spoke to the [facility] director: Hans Eden [phone]. She states they will need to see the d/c summary before agreeing to accept back.  ?Per Melissa pt was wheelchair bound but could move the wheelchair himself. He was able to feed self with left hand with assistance.  ?TOC is following for medical readiness and will fax required information to [facility].  ? ?Expected Discharge Plan: Group Home ?Barriers to Discharge: Continued Medical Work up ? ? ?Patient Goals and CMS Choice ?  ?  ?Choice offered to / list presented to : Sibling ? ?Expected Discharge Plan and Services ?Expected Discharge Plan: Group Home ?  ?Discharge Planning Services: CM Consult ?  ?Living arrangements for the past 2 months: Group Home ?                ?  ?  ?  ?  ?  ?  ?  ?  ?  ?  ? ?Prior Living Arrangements/Services ?Living arrangements for the past 2 months: Group Home ?Lives with:: Facility Resident ?Patient language and need for interpreter reviewed:: Yes ?       ?Need for Family Participation in Patient Care: Yes (Comment) ?Care giver support system in place?: Yes (comment) ?Current home services: DME (wheelchair) ?Criminal Activity/Legal Involvement Pertinent to Current Situation/Hospitalization: No - Comment as needed ? ?Activities of Daily Living ?Home Assistive Devices/Equipment: Wheelchair, Eyeglasses, Shower chair with back ?ADL Screening (condition at time of admission) ?Patient's cognitive ability adequate to safely complete daily activities?: No ?Is the patient deaf or have difficulty hearing?: No ?Does the patient have  difficulty seeing, even when wearing glasses/contacts?: No (wears glasses) ?Does the patient have difficulty concentrating, remembering, or making decisions?: Yes ?Patient able to express need for assistance with ADLs?: No (vocalizes what he wants for most of part, can't say he is in pain) ?Does the patient have difficulty dressing or bathing?: Yes ?Independently performs ADLs?: No ?Communication: Independent ?Dressing (OT): Independent ?Grooming: Needs assistance ?Feeding: Independent ?Bathing: Needs assistance ?Is this a change from baseline?: Pre-admission baseline ?Toileting: Needs assistance ?Is this a change from baseline?: Pre-admission baseline ?In/Out Bed: Needs assistance ?Is this a change from baseline?: Pre-admission baseline ?Walks in Home: Dependent (uses wheelchair) ?Is this a change from baseline?: Pre-admission baseline ?Does the patient have difficulty walking or climbing stairs?: Yes ?Weakness of Legs: Both ?Weakness of Arms/Hands: Both ? ?Permission Sought/Granted ?  ?  ?   ?   ?   ?   ? ?Emotional Assessment ?  ?  ?  ?  ?  ?Psych Involvement: No (comment) ? ?Admission diagnosis:  Seizure (HCC) [R56.9] ?Patient Active Problem List  ? Diagnosis Date Noted  ? Seizure (HCC) 04/15/2022  ? Cerebral palsy (HCC) 04/15/2022  ? 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 Wheelchair dependent 04/15/2022  ? HTN (hypertension) 04/15/2022  ? Goals of care, counseling/discussion 04/15/2022  ? Acute encephalopathy 04/15/2022  ? ?PCP:  Lucretia Field ?Pharmacy:   ?[facility] -  [place], [place] - [address] ?[address] ?[place] [address] ?Phone: [phone] Fax: [phone] ? ? ? ? ?Social Determinants of Health (SDOH) Interventions ?  ? ?Readmission Risk Interventions ?   ? View : No data to display.  ?  ?  ?  ? ? ? ?

## 2022-04-18 NOTE — Progress Notes (Signed)
CM spoke to patient's brother over the phone. He was able to provide some information but encouraged CM to reach out to patients group home: [facility].  ?CM has left 2 voicemails for Tomika at [facility].  ?TOC following. ?

## 2022-04-18 NOTE — Assessment & Plan Note (Addendum)
-   K-Dur 40 mEq p.o. x1.   ?-Repeat labs in the a.m. ?

## 2022-04-19 ENCOUNTER — Inpatient Hospital Stay (HOSPITAL_COMMUNITY): Payer: Medicare Other

## 2022-04-19 ENCOUNTER — Other Ambulatory Visit (HOSPITAL_COMMUNITY): Payer: Self-pay

## 2022-04-19 DIAGNOSIS — R569 Unspecified convulsions: Secondary | ICD-10-CM | POA: Diagnosis not present

## 2022-04-19 DIAGNOSIS — M62421 Contracture of muscle, right upper arm: Secondary | ICD-10-CM | POA: Diagnosis not present

## 2022-04-19 DIAGNOSIS — E876 Hypokalemia: Secondary | ICD-10-CM

## 2022-04-19 DIAGNOSIS — E872 Acidosis, unspecified: Secondary | ICD-10-CM

## 2022-04-19 DIAGNOSIS — G9340 Encephalopathy, unspecified: Secondary | ICD-10-CM | POA: Diagnosis not present

## 2022-04-19 DIAGNOSIS — G809 Cerebral palsy, unspecified: Secondary | ICD-10-CM | POA: Diagnosis not present

## 2022-04-19 LAB — CBC WITH DIFFERENTIAL/PLATELET
Abs Immature Granulocytes: 0.02 10*3/uL (ref 0.00–0.07)
Basophils Absolute: 0.1 10*3/uL (ref 0.0–0.1)
Basophils Relative: 1 %
Eosinophils Absolute: 0.1 10*3/uL (ref 0.0–0.5)
Eosinophils Relative: 2 %
HCT: 41.3 % (ref 39.0–52.0)
Hemoglobin: 14.5 g/dL (ref 13.0–17.0)
Immature Granulocytes: 0 %
Lymphocytes Relative: 19 %
Lymphs Abs: 1.4 10*3/uL (ref 0.7–4.0)
MCH: 32.4 pg (ref 26.0–34.0)
MCHC: 35.1 g/dL (ref 30.0–36.0)
MCV: 92.4 fL (ref 80.0–100.0)
Monocytes Absolute: 0.9 10*3/uL (ref 0.1–1.0)
Monocytes Relative: 12 %
Neutro Abs: 4.8 10*3/uL (ref 1.7–7.7)
Neutrophils Relative %: 66 %
Platelets: 221 10*3/uL (ref 150–400)
RBC: 4.47 MIL/uL (ref 4.22–5.81)
RDW: 13.6 % (ref 11.5–15.5)
WBC: 7.3 10*3/uL (ref 4.0–10.5)
nRBC: 0 % (ref 0.0–0.2)

## 2022-04-19 LAB — URINALYSIS, ROUTINE W REFLEX MICROSCOPIC
Bacteria, UA: NONE SEEN
Bilirubin Urine: NEGATIVE
Glucose, UA: NEGATIVE mg/dL
Ketones, ur: 80 mg/dL — AB
Leukocytes,Ua: NEGATIVE
Nitrite: NEGATIVE
Protein, ur: NEGATIVE mg/dL
Specific Gravity, Urine: 1.015 (ref 1.005–1.030)
pH: 5 (ref 5.0–8.0)

## 2022-04-19 LAB — BASIC METABOLIC PANEL
Anion gap: 15 (ref 5–15)
BUN: <5 mg/dL — ABNORMAL LOW (ref 8–23)
CO2: 16 mmol/L — ABNORMAL LOW (ref 22–32)
Calcium: 8.7 mg/dL — ABNORMAL LOW (ref 8.9–10.3)
Chloride: 107 mmol/L (ref 98–111)
Creatinine, Ser: 0.88 mg/dL (ref 0.61–1.24)
GFR, Estimated: >60 mL/min (ref >60)
Glucose, Bld: 86 mg/dL (ref 70–99)
Potassium: 3.4 mmol/L — ABNORMAL LOW (ref 3.5–5.1)
Sodium: 138 mmol/L (ref 135–145)

## 2022-04-19 MED ORDER — POTASSIUM CHLORIDE CRYS ER 20 MEQ PO TBCR
40.0000 meq | EXTENDED_RELEASE_TABLET | Freq: Once | ORAL | Status: AC
  Administered 2022-04-19: 40 meq via ORAL
  Filled 2022-04-19: qty 2

## 2022-04-19 MED ORDER — OLANZAPINE 5 MG PO TBDP
2.5000 mg | ORAL_TABLET | Freq: Two times a day (BID) | ORAL | Status: DC | PRN
  Administered 2022-04-19: 2.5 mg via ORAL
  Filled 2022-04-19 (×2): qty 0.5

## 2022-04-19 MED ORDER — POTASSIUM CHLORIDE 10 MEQ/100ML IV SOLN
10.0000 meq | INTRAVENOUS | Status: DC
  Administered 2022-04-19: 10 meq via INTRAVENOUS
  Filled 2022-04-19 (×4): qty 100

## 2022-04-19 MED ORDER — DULOXETINE HCL 30 MG PO CPEP
30.0000 mg | ORAL_CAPSULE | Freq: Every day | ORAL | Status: DC
  Administered 2022-04-19 – 2022-04-21 (×3): 30 mg via ORAL
  Filled 2022-04-19 (×3): qty 1

## 2022-04-19 MED ORDER — STERILE WATER FOR INJECTION IV SOLN
INTRAVENOUS | Status: DC
  Filled 2022-04-19 (×2): qty 1000

## 2022-04-19 MED ORDER — POLYETHYLENE GLYCOL 3350 17 G PO PACK
17.0000 g | PACK | ORAL | Status: DC
  Administered 2022-04-19 – 2022-04-21 (×2): 17 g via ORAL
  Filled 2022-04-19 (×2): qty 1

## 2022-04-19 MED ORDER — LISINOPRIL 10 MG PO TABS
10.0000 mg | ORAL_TABLET | Freq: Every evening | ORAL | Status: DC
  Administered 2022-04-19 – 2022-04-20 (×2): 10 mg via ORAL
  Filled 2022-04-19 (×2): qty 1

## 2022-04-19 NOTE — Progress Notes (Addendum)
Neurology Progress Note ? ? ?S:// ?Patient is awake and communicating with me today. He is oriented to self. He tells me he wants to watch TV and go home. No family at bedside  ? ? ?O:// ?Current vital signs: ?BP 131/85 (BP Location: Left Arm)   Pulse 91   Temp 97.9 ?F (36.6 ?C) (Oral)   Resp 19   Ht _0  (1.676 m)   Wt 68.3 kg   SpO2 99%   BMI 24.30 kg/m?  ?Vital signs in last 24 hours: ?Temp:  [97.7 ?F (36.5 ?C)-98.2 ?F (36.8 ?C)] 97.9 ?F (36.6 ?C) (05/02 0357) ?Pulse Rate:  [82-106] 91 (05/02 0357) ?Resp:  [17-19] 19 (05/02 0357) ?BP: (131-150)/(78-98) 131/85 (05/02 0357) ?SpO2:  [99 %-100 %] 99 % (05/01 1550) ? ?GENERAL: Awake, alert in NAD ?HEENT: - Normocephalic and atraumatic, dry mm ?LUNGS - Clear to auscultation bilaterally with no wheezes ?CV - S1S2 RRR, no m/r/g, equal pulses bilaterally. ?ABDOMEN - Soft, nontender, nondistended with normoactive BS ?Ext: warm, well perfused, intact peripheral pulses, no  edema ? ?NEURO:  ?Mental Status:awake and alert to self. Follows simple commands, MAE  ?Language: speech is clear  ?Cranial Nerves: PERRL 63m/brisk. EOMI, visual fields full, no facial asymmetry, facial sensation intact, hearing intact,  No evidence of tongue atrophy or fibrillations ?Motor: moves all extremities antigravity ?Tone: is increased  ?Sensation- Intact to light touch bilaterally ?Coordination: unable to assess  ?Gait- deferred ? ?Medications ? ?Current Facility-Administered Medications:  ?  0.9 %  sodium chloride infusion, , Intravenous, Continuous, TEugenie Filler MD, Last Rate: 75 mL/hr at 04/17/22 0808, Rate Change at 04/17/22 0808 ?  acetaminophen (TYLENOL) tablet 650 mg, 650 mg, Oral, Q4H PRN **OR** acetaminophen (TYLENOL) suppository 650 mg, 650 mg, Rectal, Q4H PRN, MKathie Dike MD ?  chlorhexidine gluconate (MEDLINE KIT) (PERIDEX) 0.12 % solution 15 mL, 15 mL, Mouth Rinse, BID, Memon, Jehanzeb, MD, 15 mL at 04/18/22 2048 ?  enoxaparin (LOVENOX) injection 40 mg, 40 mg,  Subcutaneous, Q24H, Memon, Jehanzeb, MD, 40 mg at 04/18/22 2212 ?  lacosamide (VIMPAT) oral solution 100 mg, 100 mg, Oral, BID, TEugenie Filler MD ?  levETIRAcetam (KEPPRA) 100 MG/ML solution 1,500 mg, 1,500 mg, Oral, BID, TEugenie Filler MD ?  LORazepam (ATIVAN) injection 2 mg, 2 mg, Intravenous, Once PRN, Bhagat, Srishti L, MD ?  LORazepam (ATIVAN) injection 4 mg, 4 mg, Intravenous, Q5 Min x 2 PRN, MKathie Dike MD, 2 mg at 04/16/22 0602 ?  MEDLINE mouth rinse, 15 mL, Mouth Rinse, 10 times per day, MKathie Dike MD, 15 mL at 04/18/22 2210 ?  ondansetron (ZOFRAN) tablet 4 mg, 4 mg, Oral, Q6H PRN **OR** ondansetron (ZOFRAN) injection 4 mg, 4 mg, Intravenous, Q6H PRN, MKathie Dike MD ? ? ? ?Imaging ?I have reviewed images in epic and the results pertinent to this consultation are: ? 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 No acute abnormality is noted. ? ?EEG  ?This study is suggestive of moderate diffuse encephalopathy, nonspecific etiology. No seizures or epileptiform discharges were seen throughout the recording. ? ?Assessment:  ?Mr. Howard Kumpfis a 71y.o. male [facility] He has a past medical history significant for cerebral palsy, intellectual disability, seizures on Topamax and gabapentin and typically has 1-2 breakthrough seizures a ?month. He had at least 6 seizures between seizures at home and  at [facility] ED and then had 3 back to back seizures after arrival to [facility]. ?  ?Recommendations: ?-Continue Keppra 1500 mg twice daily  ?-Continue Vimpat 100 mg twice daily ?-Would not resume gabapentin or topiramate at discharge ?- Follow up with outpatient neurology  ?- Neurology will sign off. Please call with questions or concerns ? ?Beulah Gandy DNP, ACNPC, AG  ? ?I have seen the patient and reviewed the  above note.  The assessment and plan were jointly formulated.  He has been changed from gabapentin and Topamax to Keppra and Vimpat, I would continue these as he seems to be doing well on them.  No further recommendations at this time, please call with further questions or concerns. ? ?Roland Rack, MD ?[facility] ?[phone] ? ?If 7pm- 7am, please page neurology on call as listed in [place]. ? ? ?

## 2022-04-19 NOTE — Evaluation (Signed)
Physical Therapy Evaluation ?Patient Details ?Name: Howard Best ?MRN: [id number] ?DOB: [birth year] ?Today's Date: 04/19/2022 ? ?History of Present Illness ? Pt is a 71 y/o M presenting to [facility] on 4/28 from group home with multiple breakthrough seizures, then transferred to [facility] for further neurology workup. CT negative, EEG showing moderate diffuse encephalopathy. PMH includes intellectual disability, cerebral palsy, and seizures.  ?Clinical Impression ? Pt admitted with/for breakthrough seizures.  Presently weak, needing min to moderate assist OOB to the recliner..  Pt currently limited functionally due to the problems listed below.  (see problems list.)  Pt will benefit from PT to maximize function and safety to be able to get home safely with available assist. ?   ?   ? ?Recommendations for follow up therapy are one component of a multi-disciplinary discharge planning process, led by the attending physician.  Recommendations may be updated based on patient status, additional functional criteria and insurance authorization. ? ?Follow Up Recommendations Home health PT ? ?  ?Assistance Recommended at Discharge Intermittent Supervision/Assistance  ?Patient can return home with the following ? A little help with walking and/or transfers;A little help with bathing/dressing/bathroom;Assistance with feeding;Assist for transportation;Help with stairs or ramp for entrance;Direct supervision/assist for financial management;Direct supervision/assist for medications management ? ?  ?Equipment Recommendations    ?Recommendations for Other Services ?    ?  ?Functional Status Assessment Patient has had a recent decline in their functional status and demonstrates the ability to make significant improvements in function in a reasonable and predictable amount of time.  ? ?  ?Precautions / Restrictions Precautions ?Precautions: Fall ?Restrictions ?Weight Bearing Restrictions: No  ? ?  ? ?Mobility ? Bed Mobility ?Overal bed mobility:  Needs Assistance ?Bed Mobility: Supine to Sit ?  ?  ?Supine to sit: Mod assist, +2 for physical assistance ?  ?  ?General bed mobility comments: cues for direction.  Pt initiated and assisted legs to EOB and trunk up and forward to EOB ?  ? ?Transfers ?Overall transfer level: Needs assistance ?Equipment used: None ?Transfers: Bed to chair/wheelchair/BSC ?  ?  ?  ?Squat pivot transfers: Mod assist, +2 physical assistance ?  ?  ?General transfer comment: pt initiated reach forward and boost, but needed assist for more boost and pivot/stability assist. ?  ? ?Ambulation/Gait ?  ?  ?  ?  ?  ?  ?  ?General Gait Details: not applicable ? ?Stairs ?  ?  ?  ?  ?  ? ?Wheelchair Mobility ?  ? ?Modified Rankin (Stroke Patients Only) ?  ? ?  ? ?Balance Overall balance assessment: Needs assistance ?Sitting-balance support: Single extremity supported ?Sitting balance-Leahy Scale: Poor ?Sitting balance - Comments: listing left and forward, need for minimal assist. ?  ?  ?  ?  ?  ?  ?  ?  ?  ?  ?  ?  ?  ?  ?  ?   ? ? ? ?Pertinent Vitals/Pain Pain Assessment ?Pain Assessment: Faces ?Faces Pain Scale: No hurt ?Facial Expression: Relaxed, neutral ?Body Movements: Absence of movements ?Muscle Tension: Relaxed ?Pain Intervention(s): Monitored during session  ? ? ?Home Living Family/patient expects to be discharged to:: Group home ?  ?  ?  ?  ?  ?  ?  ?  ?  ?   ?  ?Prior Function Prior Level of Function : Needs assist ?  ?  ?  ?Physical Assist : ADLs (physical);Mobility (physical) ?  ?  ?Mobility Comments: w/c dependent, could  self propel w/c per chart review ?ADLs Comments: depenent for ADLs, could self feed with L hand per chart review ?  ? ? ?Hand Dominance  ?   ? ?  ?Extremity/Trunk Assessment  ? 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  ? ?Lower Extremity Assessment ?Lower Extremity  Assessment: Generalized weakness (bil contractured.  Pt assists withe B LE during transfers) ?  ? ?Cervical / Trunk Assessment ?Cervical / Trunk Assessment:  (scoliotic)  ?Communication  ? Communication: Expressive difficulties  ?Cognition Arousal/Alertness: Awake/alert ?Behavior During Therapy: Restless, Flat affect ?Overall Cognitive Status: History of cognitive impairments - at baseline ?  ?  ?  ?  ?  ?  ?  ?  ?  ?  ?  ?  ?  ?  ?  ?  ?  ?  ?  ? ?  ?General Comments General comments (skin integrity, edema, etc.): positioned in the chair with leg rest up, pillows for LE contracture, pillows for positioning upright. ? ?  ?Exercises    ? ?Assessment/Plan  ?  ?PT Assessment Patient needs continued PT services  ?PT Problem List Decreased strength;Decreased activity tolerance;Decreased mobility;Decreased coordination;Decreased balance ? ?   ?  ?PT Treatment Interventions Functional mobility training;Therapeutic activities;Balance training;Patient/family education   ? ?PT Goals (Current goals can be found in the Care Plan section)  ?Acute Rehab PT Goals ?Patient Stated Goal: pt did not participate ?PT Goal Formulation: Patient unable to participate in goal setting ?Time For Goal Achievement: 05/03/22 ?Potential to Achieve Goals: Fair ? ?  ?Frequency Min 3X/week ?  ? ? ?Co-evaluation   ?  ?  ?  ?  ? ? ?  ?AM-PAC PT "6 Clicks" Mobility  ?Outcome Measure Help needed turning from your back to your side while in a flat bed without using bedrails?: A Little ?Help needed moving from lying on your back to sitting on the side of a flat bed without using bedrails?: A Lot ?Help needed moving to and from a bed to a chair (including a wheelchair)?: A Lot ?Help needed standing up from a chair using your arms (e.g., wheelchair or bedside chair)?: Total ?Help needed to walk in hospital room?: Total ?Help needed climbing 3-5 steps with a railing? : Total ?6 Click Score: 10 ? ?  ?End of Session   ?Activity Tolerance: Patient tolerated  treatment well ?Patient left: in bed;in chair;with call bell/phone within reach;with chair alarm set ?Nurse Communication: Mobility status ?PT Visit Diagnosis: Other abnormalities of gait and mobility (R26.89);Other symptoms and signs involving the nervous system (R29.898) ?  ? ?Time: [id number] ?PT Time Calculation (min) (ACUTE ONLY): 31 min ? ? ?Charges:   PT Evaluation ?$PT Eval Moderate Complexity: 1 Mod ?  ?  ?   ? ? ?04/19/2022 ? ?Ginger Carne., PT ?Acute Rehabilitation Services ?[phone]  (pager) ?[phone]  (office) ? ?Tessie Fass Laquanda Bick ?04/19/2022, 3:59 PM ? ?

## 2022-04-19 NOTE — Progress Notes (Signed)
?PROGRESS NOTE ? ? ? ?Howard Best  [id number] DOB: Aug 15, 1951 DOA: 04/15/2022 ?PCP: Trinidad Curet  ? ? ?Chief Complaint  ?Patient presents with  ? Seizures  ? ? ?Brief Narrative:  ?No notes on file  ? ? ?Assessment & Plan: ? Principal Problem: ?  Seizure ([place]) ?Active Problems: ?  Cerebral palsy ([place]) ?  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  Wheelchair dependent ?  HTN (hypertension) ?  Goals of care, counseling/discussion ?  Acute encephalopathy ?  Hypokalemia ?  Acidosis ? ? ? ?Assessment and Plan: ?* Seizure ([place]) ?Chronically on Topamax as well as gabapentin ?Group home staff reports that he has been compliant with medications  ?no recent changes in meds, no recent fever or other signs of infection ?He reportedly has approximately 1-2 seizures per month which are usually treated with intranasal diazepam ?He follows with a neurologist in [facility] [employer] as needed.  Staff/family unaware of who exactly he sees ?Had increasing seizures the day prior to admission, with approximately 6 seizures today, all lasting approximately 1 minute ?Seizure episodes followed by lethargy ?CT head performed emergency room without acute findings ?Case reviewed with Dr. Erlinda Hong with neurology who requested transfer to [facility] for further neuro evaluation ?Patient was loaded with IV Keppra 2 g and will be continued on 1.5 g IV every 12 hours ?-Patient also on Vimpat 100 mg twice daily which he seems to be tolerating. ?He also received loading dose of fosphenytoin.  ?Continue to hold Topamax and gabapentin for now and WILL NOT resume on discharge per neurology recommendations.   ?Patient on continuous EEG which showed moderate diffuse encephalopathy, nonspecific etiology, no seizures or epileptiform discharges were seen throughout the recording. ?Continuous EEG discontinued.. ?-Patient being followed by neurology who recommend holding fosphenytoin at this time as this could worsen patient's somnolence.  Phenytoin  levels noted to be elevated ?Continue IV Ativan 1 to 2 mg for seizures lasting more than 3 minutes or more than 3 seizures in an hour. ?-Patient alert today, clinically improved. ?Continue Keppra and Vimpat and transition to oral today.  ?-Seizure precautions. ?Per neurology. ? ?Acidosis ?Patient acidotic with CO2 of 16. ?-Patient afebrile. ?-Check a UA with cultures and sensitivities.  Check a chest x-ray. ?-Place on bicarb drip. ?-Repeat labs in the AM. ? ?Hypokalemia ?- K-Dur 40 mEq p.o. x1.   ?-Repeat labs in the a.m. ? ?Acute encephalopathy ?Patient lethargic, postictal from seizures on presentation early on during the hospitalization.  Also received Ativan during seizure episodes. ?Normally he is awake, usually answers questions with yes and no ?Was lethargic/somnolent felt likely secondary to phenytoin as phenytoin level noted to be elevated and likely take a while to be excreted.   Appears to be protecting airway and vitals are otherwise stable.   ?-Patient alert over the past 2 days, somewhat interactive, somewhat following simple commands. ? ?Goals of care, counseling/discussion ?Dr. Roderic Palau discussed CODE STATUS with patient's brother Mansur Patti who is his co-legal guardian.  He feels it would be appropriate for patient to be DNR during his hospital stay, in case his overall condition declines. ? ?HTN (hypertension) ?Chronically on lisinopril ?BP soft/borderline on presentation. ?-Resume home regimen lisinopril.  ?Gentle hydration. ? 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 ?Cerebral palsy ([place]) ?Reportedly has cognitive deficits at baseline and mostly answers with yes and no ?Staff at group home reported that patient requires assistance with  meals ?He is wheelchair dependent ? ? ? ? ?  ? ? ?DVT prophylaxis: Lovenox ?Code Status: DNR ?Family Communication: No family at bedside ?Disposition: Likely  back to group home. ? ?Status is: Inpatient ?Remains inpatient appropriate because: Severity of illness ?  ?Consultants:  ?Neurology: Dr.Khaliqdina 04/16/2022 ? ?Procedures:  ?CT head 04/15/2022 ?Continuous EEG ? ? ?Antimicrobials:  ?None ? ? ?Subjective: ?Alert.  Follows some commands.  No chest pain.  No shortness of breath.  Asking when he can go home.   ? ?Objective: ?Vitals:  ? 04/19/22 0357 04/19/22 0900 04/19/22 1136 04/19/22 1602  ?BP: 131/85 (!) 148/95 (!) 143/88 138/87  ?Pulse: 91 90 (!) 105 (!) 102  ?Resp: 19   20  ?Temp: 97.9 ?F (36.6 ?C) 98.1 ?F (36.7 ?C) 98.5 ?F (36.9 ?C) 98.2 ?F (36.8 ?C)  ?TempSrc: Oral Oral Oral Oral  ?SpO2:  99% 99% 99%  ?Weight:      ?Height:      ? ? ?Intake/Output Summary (Last 24 hours) at 04/19/2022 1749 ?Last data filed at 04/19/2022 0600 ?Gross per 24 hour  ?Intake --  ?Output 400 ml  ?Net -400 ml  ? ?Filed Weights  ? 04/15/22 1756 04/16/22 0011  ?Weight: 67.6 kg 68.3 kg  ? ? ?Examination: ? ?General exam: Alert.  Laying in fetal position.   ?Respiratory system: CTA B.  No wheezes, no crackles, no rhonchi.  Normal respiratory effort.   ?Cardiovascular system: RRR no murmurs rubs or gallops.  No JVD.  No lower extremity edema.  ?Gastrointestinal system: Abdomen is soft, nontender, nondistended, positive bowel sounds.  No rebound.  No guarding.   ?Central nervous system: Alert.  Laying in fetal position.bilateral lower extremities contracted.  Moving upper extremities spontaneously.   ?Extremities: Bilateral lower extremities contracted ?Skin: No rashes, lesions or ulcers ?Psychiatry: Judgement and insight appear poor. Mood & affect appropriate.  ? ? ? ?Data Reviewed:  ? ?CBC: ?Recent Labs  ?Lab 04/15/22 ?1548 04/16/22 ?1006 04/18/22 ?0117 04/19/22 ?0337  ?WBC 7.5 6.7 7.3 7.3  ?NEUTROABS 5.0 3.8 3.9 4.8  ?HGB 13.7 14.1 13.9 14.5  ?HCT 41.1 42.8 42.1 41.3  ?MCV 96.9 96.2 95.0 92.4  ?PLT 241 215 218 221  ? ? ?Basic Metabolic Panel: ?Recent Labs  ?Lab 04/15/22 ?1548 04/16/22 ?1006  04/18/22 ?0117 04/19/22 ?0337  ?NA 138 140 140 138  ?K 4.2 3.8 3.3* 3.4*  ?CL 112* 111 109 107  ?CO2 20* 21* 19* 16*  ?GLUCOSE 81 82 79 86  ?BUN 18 13 6* <5*  ?CREATININE 1.00 0.94 0.82 0.88  ?CALCIUM 8.2* 8.7* 8.4* 8.7*  ?MG  --  2.1  --   --   ? ? ?GFR: ?Estimated Creatinine Clearance: 72.5 mL/min (by C-G formula based on SCr of 0.88 mg/dL). ? ?Liver Function Tests: ?No results for input(s): AST, ALT, ALKPHOS, BILITOT, PROT, ALBUMIN in the last 168 hours. ? ?CBG: ?Recent Labs  ?Lab 04/15/22 ?1532  ?GLUCAP 92  ? ? ? ?No results found for this or any previous visit (from the past 240 hour(s)).  ? ? ? ? ? ?Radiology Studies: ?DG CHEST PORT 1 VIEW ? ?Result Date: 04/19/2022 ?CLINICAL DATA:  Acidosis. EXAM: PORTABLE CHEST 1 VIEW COMPARISON:  03/22/2022 FINDINGS: Patient is rotated to the left. Atelectasis or infiltrate is seen in both lung bases no evidence of pleural effusion. Heart size is within limits. IMPRESSION: Mild bibasilar atelectasis versus infiltrates. Electronically Signed   By: Marlaine Hind M.D.   On: 04/19/2022 09:20   ? ? ? ? ? ?  Scheduled Meds: ? chlorhexidine gluconate (MEDLINE KIT)  15 mL Mouth Rinse BID  ? enoxaparin (LOVENOX) injection  40 mg Subcutaneous Q24H  ? lacosamide  100 mg Oral BID  ? levETIRAcetam  1,500 mg Oral BID  ? mouth rinse  15 mL Mouth Rinse 10 times per day  ? ?Continuous Infusions: ?  sodium bicarbonate (isotonic) infusion in sterile water 75 mL/hr at 04/19/22 1208  ? ? ? LOS: 4 days  ? ? ?Time spent: 35 minutes ? ? ? ?Irine Seal, MD ?[facility] ? ? ?To contact the attending provider between 7A-7P or the covering provider during after hours 7P-7A, please log into the web site [email or web address] and access using universal [place] password for that web site. If you do not have the password, please call the hospital operator. ? ?04/19/2022, 5:49 PM  ?  ?

## 2022-04-19 NOTE — Evaluation (Signed)
Occupational Therapy Evaluation ?Patient Details ?Name: Howard Best ?MRN: [id number] ?DOB: 1951-04-29 ?Today's Date: 04/19/2022 ? ? ?History of Present Illness Pt is a 71 y/o M presenting to [facility] on 4/28 from group home with multiple breakthrough seizures, then transferred to [facility] for further neurology workup. CT negative, EEG showing moderate diffuse encephalopathy. PMH includes intellectual disability, cerebral palsy, and seizures.  ? ?Clinical Impression ?  ?Per chart review, pt from group home and is dependent for ADLs and propels self in w/c for mobility. Pt currently max -total A for ADLs, mod A +2 for bed mobility and squat pivot transfers. Pt able to perform seated grooming task at EOB with mod cuing, requiring min A to maintain sitting balance at EOB, pt with R lateral and forward lean. Pt presenting with impairments listed below, will follow acutely. Recommend return to group home with HHOT at d/c. ?   ? ?Recommendations for follow up therapy are one component of a multi-disciplinary discharge planning process, led by the attending physician.  Recommendations may be updated based on patient status, additional functional criteria and insurance authorization.  ? ?Follow Up Recommendations ? Home health OT  ?  ?Assistance Recommended at Discharge Frequent or constant Supervision/Assistance  ?Patient can return home with the following Two people to help with walking and/or transfers;Two people to help with bathing/dressing/bathroom;Assistance with cooking/housework;Direct supervision/assist for medications management;Direct supervision/assist for financial management;Assistance with feeding;Assist for transportation;Help with stairs or ramp for entrance ? ?  ?Functional Status Assessment ? Patient has had a recent decline in their functional status and demonstrates the ability to make significant improvements in function in a reasonable and predictable amount of time.  ?Equipment Recommendations ?    ?   ?Recommendations for Other Services   ? ? ?  ?Precautions / Restrictions Precautions ?Precautions: Fall ?Restrictions ?Weight Bearing Restrictions: No  ? ?  ? ?Mobility Bed Mobility ?Overal bed mobility: Needs Assistance ?Bed Mobility: Supine to Sit ?  ?  ?Supine to sit: Mod assist, +2 for physical assistance ?  ?  ?  ?  ? ?Transfers ?Overall transfer level: Needs assistance ?Equipment used: None ?Transfers: Bed to chair/wheelchair/BSC ?  ?  ?Squat pivot transfers: Mod assist, +2 physical assistance ?  ?  ?  ?  ?  ? ?  ?Balance Overall balance assessment: Needs assistance ?Sitting-balance support: Single extremity supported ?Sitting balance-Leahy Scale: Poor ?Sitting balance - Comments: R lateral and forward lean in sitting ?  ?  ?  ?  ?  ?  ?  ?  ?  ?  ?  ?  ?  ?  ?  ?   ? ?ADL either performed or assessed with clinical judgement  ? ?ADL Overall ADL's : Needs assistance/impaired ?Eating/Feeding: Moderate assistance;Sitting ?  ?Grooming: Set up;Wash/dry face;Wash/dry hands;Sitting ?Grooming Details (indicate cue type and reason): washes face sitting EOB ?Upper Body Bathing: Maximal assistance ?  ?Lower Body Bathing: Maximal assistance ?  ?Upper Body Dressing : Maximal assistance ?  ?Lower Body Dressing: Maximal assistance ?Lower Body Dressing Details (indicate cue type and reason): to don socks EOB ?Toilet Transfer: Maximal assistance;Squat-pivot;BSC/3in1 ?Toilet Transfer Details (indicate cue type and reason): simulated to chair ?Toileting- Clothing Manipulation and Hygiene: Total assistance;Sit to/from stand ?Toileting - Clothing Manipulation Details (indicate cue type and reason): for pericare ?  ?  ?Functional mobility during ADLs: Moderate assistance;+2 for physical assistance ?   ? ? ? ?Vision   ?Vision Assessment?: No apparent visual deficits  ?   ?Perception   ?  ?  Praxis   ?  ? ?Pertinent Vitals/Pain Pain Assessment ?Pain Assessment: Faces ?Pain Intervention(s): Limited activity within patient's tolerance,  Monitored during session, Repositioned  ? ? ? ?Hand Dominance   ?  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  ?Lower Extremity Assessment ?Lower Extremity Assessment: Defer to PT evaluation ?  ?Cervical / Trunk Assessment ?Cervical / Trunk Assessment: Other exceptions (scoliotic) ?  ?Communication Communication ?Communication: Expressive difficulties ?  ?Cognition Arousal/Alertness: Awake/alert ?Behavior During Therapy: Restless, Flat affect ?Overall Cognitive Status: History of cognitive impairments - at baseline ?  ?  ?  ?  ?  ?  ?  ?  ?  ?  ?  ?  ?  ?  ?  ?  ?  ?  ?  ?General Comments  RUE elevated on pillow for supportive positioniing ? ?  ?Exercises   ?  ?Shoulder Instructions    ? ? ?Home Living Family/patient expects to be discharged to:: Group home ?  ?  ?  ?  ?  ?  ?  ?  ?  ?  ?  ?  ?  ?  ?  ?  ?  ?  ? ?  ?Prior Functioning/Environment Prior Level of Function : Needs assist ?  ?  ?  ?Physical Assist : ADLs (physical);Mobility (physical) ?  ?  ?Mobility Comments: w/c dependent, could self propel w/c per chart review ?ADLs Comments: depenent for ADLs, could self feed with L hand per chart review ?  ? ?  ?  ?OT Problem List: Decreased strength;Decreased range of motion;Decreased activity tolerance;Impaired balance (sitting and/or standing);Decreased safety awareness;Decreased cognition;Decreased coordination;Decreased knowledge of use of DME or AE;Impaired tone;Impaired sensation;Impaired UE functional use ?  ?   ?OT Treatment/Interventions: Self-care/ADL training;Therapeutic exercise;DME and/or AE instruction;Therapeutic activities;Cognitive remediation/compensation;Patient/family education;Balance training  ?  ?OT Goals(Current goals can be found in the care plan section) Acute Rehab OT Goals ?Patient Stated  Goal: to go home on Wednesday ?OT Goal Formulation: With patient ?Time For Goal Achievement: 05/03/22 ?Potential to Achieve Goals: Good ?ADL Goals ?Pt Will Perform Eating: sitting;with adaptive utensils;with caregiver independent in assisting;with min guard assist ?Pt Will Perform Grooming: with min guard assist;with adaptive equipment;with caregiver independent in assisting;sitting ?Pt Will Transfer to Toilet: bedside commode;squat pivot transfer;with max assist;with +2 assist ?Additional ADL Goal #1: Pt will follow 2 step instruction with 100% accuracy in prep for ADLs.  ?OT Frequency: Min 2X/week ?  ? ?Co-evaluation PT/OT/SLP Co-Evaluation/Treatment: Yes ?Reason for Co-Treatment: Complexity of the patient's impairments (multi-system involvement);For patient/therapist safety;To address functional/ADL transfers ?  ?OT goals addressed during session: ADL's and self-care ?  ? ?  ?AM-PAC OT "6 Clicks" Daily Activity     ?Outcome Measure Help from another person eating meals?: A Lot ?Help from another person taking care of personal grooming?: A Lot ?Help from another person toileting, which includes using toliet, bedpan, or urinal?: Total ?Help from another person bathing (including washing, rinsing, drying)?: Total ?Help from another person to put on and taking off regular upper body clothing?: Total ?Help from another person to put on and taking off regular lower body clothing?: Total ?6 Click Score: 8 ?  ?End of Session Nurse Communication: Mobility status ? ?Activity Tolerance: Patient tolerated treatment well ?Patient left: in chair;with call bell/phone within reach;with chair alarm set;with restraints reapplied ? ?OT Visit Diagnosis: Unsteadiness on  feet (R26.81);Other abnormalities of gait and mobility (R26.89);Muscle weakness (generalized) (M62.81);Other symptoms and signs involving the nervous system (R29.898);Other symptoms and signs involving cognitive function  ?              ?Time: 8756-4332 ?OT Time  Calculation (min): 31 min ?Charges:  OT General Charges ?$OT Visit: 1 Visit ?OT Evaluation ?$OT Eval Moderate Complexity: 1 Mod ? ?Alfonzo Beers, OTD, OTR/L ?Acute Rehab ?[phone] ? ?Howard Best ?5/2/2

## 2022-04-19 NOTE — Assessment & Plan Note (Signed)
Patient acidotic with CO2 of 16. ?-Patient afebrile. ?-Check a UA with cultures and sensitivities.  Check a chest x-ray. ?-Place on bicarb drip. ?-Repeat labs in the AM. ?

## 2022-04-20 ENCOUNTER — Encounter (HOSPITAL_COMMUNITY): Payer: Self-pay

## 2022-04-20 DIAGNOSIS — E872 Acidosis, unspecified: Secondary | ICD-10-CM | POA: Diagnosis not present

## 2022-04-20 DIAGNOSIS — R569 Unspecified convulsions: Secondary | ICD-10-CM | POA: Diagnosis not present

## 2022-04-20 LAB — CBC WITH DIFFERENTIAL/PLATELET
Abs Immature Granulocytes: 0.02 10*3/uL (ref 0.00–0.07)
Basophils Absolute: 0 10*3/uL (ref 0.0–0.1)
Basophils Relative: 1 %
Eosinophils Absolute: 0.2 10*3/uL (ref 0.0–0.5)
Eosinophils Relative: 2 %
HCT: 41 % (ref 39.0–52.0)
Hemoglobin: 14.4 g/dL (ref 13.0–17.0)
Immature Granulocytes: 0 %
Lymphocytes Relative: 30 %
Lymphs Abs: 2.1 10*3/uL (ref 0.7–4.0)
MCH: 31.7 pg (ref 26.0–34.0)
MCHC: 35.1 g/dL (ref 30.0–36.0)
MCV: 90.3 fL (ref 80.0–100.0)
Monocytes Absolute: 1 10*3/uL (ref 0.1–1.0)
Monocytes Relative: 13 %
Neutro Abs: 3.8 10*3/uL (ref 1.7–7.7)
Neutrophils Relative %: 54 %
Platelets: 233 10*3/uL (ref 150–400)
RBC: 4.54 MIL/uL (ref 4.22–5.81)
RDW: 13.9 % (ref 11.5–15.5)
WBC: 7.1 10*3/uL (ref 4.0–10.5)
nRBC: 0 % (ref 0.0–0.2)

## 2022-04-20 LAB — MAGNESIUM: Magnesium: 1.8 mg/dL (ref 1.7–2.4)

## 2022-04-20 LAB — BASIC METABOLIC PANEL
Anion gap: 9 (ref 5–15)
BUN: <5 mg/dL — ABNORMAL LOW (ref 8–23)
CO2: 26 mmol/L (ref 22–32)
Calcium: 9.3 mg/dL (ref 8.9–10.3)
Chloride: 106 mmol/L (ref 98–111)
Creatinine, Ser: 0.79 mg/dL (ref 0.61–1.24)
GFR, Estimated: >60 mL/min (ref >60)
Glucose, Bld: 105 mg/dL — ABNORMAL HIGH (ref 70–99)
Potassium: 3.8 mmol/L (ref 3.5–5.1)
Sodium: 141 mmol/L (ref 135–145)

## 2022-04-20 LAB — URINE CULTURE: Culture: NO GROWTH

## 2022-04-20 MED ORDER — CHLORHEXIDINE GLUCONATE 0.12 % MT SOLN
OROMUCOSAL | Status: AC
  Administered 2022-04-20: 15 mL via OROMUCOSAL
  Filled 2022-04-20: qty 15

## 2022-04-20 MED ORDER — ORAL CARE MOUTH RINSE
15.0000 mL | Freq: Two times a day (BID) | OROMUCOSAL | Status: DC
  Administered 2022-04-20: 15 mL via OROMUCOSAL

## 2022-04-20 NOTE — Progress Notes (Signed)
?  Progress Note ? ? ?Patient: Howard Best JOI:[id number] DOB: 1951/06/15 DOA: 04/15/2022     5 ?DOS: the patient was seen and examined on 04/20/2022 at 10:12AM ?  ? ? ? ?Brief hospital course: ?Mr. Lehenbauer is a 71 y.o. M with cerebral palsy, wheelchair dependent, congenital cognitive impairment and hx seizures who presented with seizures. ? ?Please see prior summary. ? ? ? ? ?Assessment and Plan: ?* Seizure (HCC) ?Reportedly has approximately 1-2 seizures per month which are usually treated with intranasal diazepam.   ? ?CT head normal on admission.  Neurology consulted.  cEEG obtained, which was unremarkable. ? ?Transitioned here Topamax, gabapentin to Keppra and Vimpat, which he appears to be tolerating well.  ? ? ?Metabolic acidosis ?Unclear cause.  Now resolved.    ? ?Hypokalemia ?Treated and resolved. ? ?Acute metabolic encephalopathy ?Initially patient somnolent, poorly responsive, thought to be post-ictal.  No signs of infection noted.  ? ?Fosfenytoin appeared to be very sedating. In last few days, seems more at baseline attentiveness.  Participating in nursing cares.  Is bed bound and wheelchair bound at home.    ? ?Essential hypertension ?BP normal  ?- Continue lisionpril ? ?Cerebral palsy (HCC) ?At baseline ? ? ? ? ? ? ? ? ?Subjective: Patient has no complaints, says "go away".  Nursing report no respiratory distress, fever, vomiting, diarrhea. ? ? ? ? ?Physical Exam: ?Vitals:  ? 04/19/22 2321 04/20/22 0343 04/20/22 0815 04/20/22 1306  ?BP: 132/83 103/67 (!) 137/94 108/61  ?Pulse: (!) 104 94 79 84  ?Resp: 18 15 20 20   ?Temp: 98.6 ?F (37 ?C) 98.6 ?F (37 ?C) 98.2 ?F (36.8 ?C) 98 ?F (36.7 ?C)  ?TempSrc: Oral Oral Oral Oral  ?SpO2: 93% 95% 98% 96%  ?Weight:      ?Height:      ? ?Adult male, stigmata of cerebral palsy with contractures in all 4 extremities ?RRR, no murmurs, no peripheral edema ?Respiratory effort normal, lungs clear without rales or wheezes ?Abdomen soft, no grimace to palpation, no rigidity or  rebound ?Contractures in all 4 extremities, atrophy of the muscles in all 4 extremities consistent with chronic bedbound state ?Makes eye contact, does not follow commands, says "go away", and "cut the noise out" ? ? ? ? ?Data Reviewed: ?Neurology notes reviewed, nursing notes reviewed, vital signs reviewed ?Complete blood count and basic metabolic panel normal ? ?Family Communication:   ? ? ? ?Disposition: ?Status is: Inpatient ?The patient was admitted with breakthrough seizures.  His antiseizure medications have been adjusted.  He is now medically stable for discharge back to his facility. ? ? ? ? ? ? ? ?Author: ? , MD ?04/20/2022 2:15 PM ? ?For on call review [email or web address].  ? ? ?

## 2022-04-20 NOTE — Hospital Course (Signed)
Mr. Howard Best is a 71 y.o. M with cerebral palsy, wheelchair dependent, congenital cognitive impairment and hx seizures who presented with seizures. ? ?Please see prior summary. ?

## 2022-04-20 NOTE — TOC Progression Note (Signed)
Transition of Care (TOC) - Progression Note  ? ? ?Patient Details  ?Name: Howard Best ?MRN: [id number] ?Date of Birth: [birth year] ? ?Transition of Care (TOC) CM/SW Contact  ?Pollie Friar, RN ?Phone Number: ?04/20/2022, 10:38 AM ? ?Clinical Narrative:    ?CM spoke to [facility] with [facility]. She provided fax number for nursing to review information for a discharge back to the facility tomorrow 5/4. CM has faxed the information to: [phone] ?Melissa states they will arrange therapy at the facility. Orders for [facility] sent to [place].  ?Facility will provide transportation for the patient at d/c.  ?TOC following. ? ? ?Expected Discharge Plan: Group Home ?Barriers to Discharge: Continued Medical Work up ? ?Expected Discharge Plan and Services ?Expected Discharge Plan: Group Home ?  ?Discharge Planning Services: CM Consult ?  ?Living arrangements for the past 2 months: Group Home ?                ?  ?  ?  ?  ?  ?  ?  ?  ?  ?  ? ? ?Social Determinants of Health (SDOH) Interventions ?  ? ?Readmission Risk Interventions ?   ? View : No data to display.  ?  ?  ?  ? ? ?

## 2022-04-20 NOTE — Care Plan (Signed)
Spoke with brother Merleen Milliner by phone. ?

## 2022-04-21 ENCOUNTER — Other Ambulatory Visit (HOSPITAL_COMMUNITY): Payer: Self-pay

## 2022-04-21 DIAGNOSIS — E872 Acidosis, unspecified: Secondary | ICD-10-CM | POA: Diagnosis not present

## 2022-04-21 DIAGNOSIS — R569 Unspecified convulsions: Secondary | ICD-10-CM | POA: Diagnosis not present

## 2022-04-21 DIAGNOSIS — G809 Cerebral palsy, unspecified: Secondary | ICD-10-CM | POA: Diagnosis not present

## 2022-04-21 DIAGNOSIS — G9341 Metabolic encephalopathy: Secondary | ICD-10-CM | POA: Diagnosis not present

## 2022-04-21 MED ORDER — LACOSAMIDE 100 MG PO TABS
100.0000 mg | ORAL_TABLET | Freq: Two times a day (BID) | ORAL | 3 refills | Status: DC
Start: 2022-04-21 — End: 2022-08-01
  Filled 2022-04-21: qty 60, 30d supply, fill #0

## 2022-04-21 MED ORDER — LEVETIRACETAM 750 MG PO TABS
1500.0000 mg | ORAL_TABLET | Freq: Two times a day (BID) | ORAL | 3 refills | Status: DC
  Filled 2022-04-21: qty 120, 30d supply, fill #0

## 2022-04-21 MED ORDER — CHLORHEXIDINE GLUCONATE 0.12 % MT SOLN
OROMUCOSAL | Status: AC
  Administered 2022-04-21: 15 mL via OROMUCOSAL
  Filled 2022-04-21: qty 15

## 2022-04-21 NOTE — Discharge Summary (Signed)
?Physician Discharge Summary ?  ?Patient: Howard Best MRN: [id number] DOB: 1951/09/11  ?Admit date:     04/15/2022  ?Discharge date: 04/21/22  ?Discharge Physician: Alberteen Sam  ? ?PCP: Royals, Gretta Began  ? ? ? ?Recommendations at discharge:  ?Stop gabapentin and Topamax and replace with Vimpat and Keppra for seizures ?Follow up with PCP in 1 week ?Follow up with Neurology in 4-6 weeks; if patient not established with Neurology, schedule new referral to Neurology in 4 weeks ? ? ? ? ?Discharge Diagnoses: ?Principal Problem: ?  Seizure (HCC) ?Active Problems: ?  Acute metabolic encephalopathy ?  Cerebral palsy (HCC) ?  Wheelchair dependent ?  Essential hypertension ?  Metabolic acidosis ?  Hypokalemia ? ?  ? ? ?Hospital Course: ?Howard Best is a 71 y.o. M with cerebral palsy, wheelchair dependent, congenital cognitive impairment and hx seizures who presented with seizures. ? ?Evidently, patient has occasional breakthrough seizures (1-2 per month by one report, 1-2 per year by another).  On the day of admission, patient had a cluster of 6 seizures and then was sleepy and unresponsive so EMS were called. ? ?In the hospital, neuraxial imaging unremarkable.  EEG normal.  No evidence of infection or electrolyte abnormality other than mild non-gap metabolic acidosis and mild hypokalemia (in the setting of stopping Topamax) which corrected. ? ?His gabapentin and Topamax were stopped and he was transitioned to Keppra and Vimpat.  He should have close Neurology follow up. ?  ? ? ? ? ?  ? ? ? ? ? ?The [facility] Controlled Substances Registry was reviewed for this patient prior to discharge.  ? ?Consultants: Neurology ?Procedures performed: EEG, CT head  ?Disposition: Group home ? ? ?DISCHARGE MEDICATION: ?Allergies as of 04/21/2022   ?No Known Allergies ?  ? ?  ?Medication List  ?  ? ?STOP taking these medications   ? ?gabapentin 600 MG tablet ?Commonly known as: NEURONTIN ?  ?topiramate 100 MG tablet ?Commonly  known as: TOPAMAX ?  ? ?  ? ?TAKE these medications   ? ?acetaminophen 500 MG tablet ?Commonly known as: TYLENOL ?Take 500 mg by mouth every 6 (six) hours as needed for moderate pain. ?  ?ACIDOPHILUS/BIFIDUS PO ?Take 2 capsules by mouth daily. ?  ?chlorhexidine 0.12 % solution ?Commonly known as: PERIDEX ?Use as directed 5 mLs in the mouth or throat at bedtime. ?  ?DULoxetine 30 MG capsule ?Commonly known as: CYMBALTA ?Take 30 mg by mouth daily. ?  ?ketoconazole 2 % shampoo ?Commonly known as: NIZORAL ?Apply 1 application. topically every Monday, Wednesday, and Friday. ?  ?Lacosamide 100 MG Tabs ?Take 1 tablet (100 mg total) by mouth in the morning and at bedtime. ?  ?levETIRAcetam 750 MG tablet ?Commonly known as: Keppra ?Take 2 tablets (1,500 mg total) by mouth 2 (two) times daily. ?  ?lisinopril 10 MG tablet ?Commonly known as: ZESTRIL ?Take 10 mg by mouth every evening. ?  ?polyethylene glycol 17 g packet ?Commonly known as: MIRALAX / GLYCOLAX ?Take 17 g by mouth every other day. ?  ?Valtoco 5 MG Dose 5 MG/0.1ML Liqd ?Generic drug: diazePAM ?Place 1 spray into the nose daily as needed (seizure). ?  ?Vitamin D 50 MCG (2000 UT) Caps ?Take 2,000 Units by mouth daily. ?  ? ?  ? ? Follow-up Information   ? ? Royals, Gretta Began. Schedule an appointment as soon as possible for a visit in 1 week(s).   ?Specialty: Family Medicine ?Contact information: ?[address] [employer] ?[employer]  [place] [id number] ?[phone] ? ? ?  ?  ? ? Patient's Neurologist. Schedule an appointment as soon as possible for a visit in 1 month(s).   ? ?  ?  ? ?  ?  ? ?  ? ? ?Discharge Instructions   ? ? Discharge instructions   Complete by: As directed ?  ? STOP gabapentin and Topamax, for seizures. ? ?START Vimpat and Keppra in their place for seizures ? ?Go see your primary care doctor in 1 week  ? ?Go see your neurologist in 4-6 weeks  ? Increase activity slowly   Complete by: As directed ?  ? ?  ? ? ?Discharge Exam: ?Filed Weights  ? 04/15/22 1756  04/16/22 0011  ?Weight: 67.6 kg 68.3 kg  ? ? ?General: Pt is alert, awake, not in acute distress, responding with yes no answers and asking about transport home ?Cardiovascular: RRR, nl S1-S2, no murmurs appreciated.   No LE edema.   ?Respiratory: Normal respiratory rate and rhythm.  CTAB without rales or wheezes. ?Abdominal: Abdomen soft and non-tender.  No distension or HSM.   ?Neuro/Psych: Baseline contractures in all four extremities.  Speech dysarthric at baseline.  Appears to be at baseline.    ? ? ?Condition at discharge: stable ? ?The results of significant diagnostics from this hospitalization (including imaging, microbiology, ancillary and laboratory) are listed below for reference.  ? ?Imaging Studies: ?DG Chest 2 View ? ?Result Date: 03/22/2022 ?CLINICAL DATA:  cough and shortness of breath EXAM: CHEST - 2 VIEW.  Markedly rotated patient on frontal view. COMPARISON:  Chest x-ray 08/30/2021 FINDINGS: The heart and mediastinal contours are grossly unremarkable. Hiatal hernia. No focal consolidation. No pulmonary edema. At least trace left pleural effusion. No pneumothorax. No acute osseous abnormality. IMPRESSION: 1. At least trace left pleural effusion. 2. Hiatal hernia. 3. Limited evaluation due to patient rotation on frontal view. Electronically Signed   By: Tish FredericksonMorgane  Naveau M.D.   On: 03/22/2022 17:13  ? ?CT Head Wo Contrast ? ?Result Date: 04/15/2022 ?CLINICAL DATA:  Altered mental status. EXAM: CT HEAD WITHOUT CONTRAST TECHNIQUE: Contiguous axial images were obtained from the base of the skull through the vertex without intravenous contrast. RADIATION DOSE REDUCTION: This exam was performed according to the departmental dose-optimization program which includes automated exposure control, adjustment of the mA and/or kV according to patient size and/or use of iterative reconstruction technique. COMPARISON:  August 02, 2007. FINDINGS: Brain: No mass effect or midline shift is noted. Severe dilatation of the  posterior portions of the lateral ventricles is noted with overlying cortical atrophy which is unchanged compared to prior exam. No definite hemorrhage, acute infarction or mass lesion is noted. Stable left frontal encephalomalacia is noted. Vascular: No hyperdense vessel or unexpected calcification. Skull: Normal. Negative for fracture or focal lesion. Sinuses/Orbits: No acute finding. Other: None. 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 No acute abnormality is noted. Electronically Signed   By: Lupita RaiderJames  Green Jr M.D.   On: 04/15/2022 16:57  ? ?DG CHEST PORT 1 VIEW ? ?Result Date: 04/19/2022 ?CLINICAL DATA:  Acidosis. EXAM: PORTABLE CHEST 1 VIEW COMPARISON:  03/22/2022 FINDINGS: Patient is rotated to the left. Atelectasis or infiltrate is seen in both lung bases no evidence of pleural effusion. Heart size is within limits. IMPRESSION: Mild bibasilar atelectasis versus infiltrates. Electronically Signed   By: Danae OrleansJohn A Stahl M.D.   On: 04/19/2022 09:20  ? ?Overnight EEG with video ? ?Result Date: 04/17/2022 ?  Charlsie Quest, MD     04/17/2022  9:26 AM Patient Name: CAISEN MANGAS MRN: [id number] Epilepsy Attending: Charlsie Quest Referring Physician/Provider: Gordy Councilman, MD Duration: 04/16/2022 0902 to 04/17/2022 0902 Patient history:  71 y.o. male with PMH significant for cerebral palsy, intellectual disability, seizures on Topamax and gabapentin and typically has 1-2 breakthrough seizures a month who presents today after several seizures. EEG to evaluate for seizures. Level of alertness: Awake, asleep AEDs during EEG study: LEV, LCM Technical aspects: This EEG study was done with scalp electrodes positioned according to the 10-20 International system of electrode placement. Electrical activity was acquired at a sampling rate of 500Hz  and reviewed with a high frequency filter of 70Hz  and a low frequency filter of 1Hz . EEG data were recorded  continuously and digitally stored. Description: No clear posterior dominant rhythm was seen. Sleep was characterized by vertex waves, sleep spindles (12 to 14 Hz), maximal frontocentral region. EEG showed cont

## 2022-04-21 NOTE — TOC Transition Note (Signed)
Transition of Care (TOC) - CM/SW Discharge Note ? ? ?Patient Details  ?Name: AUDI WETTSTEIN ?MRN: [id number] ?Date of Birth: Nov 03, 1951 ? ?Transition of Care (TOC) CM/SW Contact:  ?Kermit Balo, RN ?Phone Number: ?04/21/2022, 12:26 PM ? ? ?Clinical Narrative:    ?Patient is discharging back to [facility] today. [facility] will arrange the therapies at the facility. They have the orders for the [facility] services. D/c summary faxed to [facility]. They will also provide transport back to [facility].  ? ? ?Final next level of care: Home w Home Health Services ?Barriers to Discharge: No Barriers Identified ? ? ?Patient Goals and CMS Choice ?  ?CMS Medicare.gov Compare Post Acute Care list provided to:: Patient Represenative (must comment) ?Choice offered to / list presented to : Sibling ? ?Discharge Placement ?  ?           ?  ?  ?  ?  ? ?Discharge Plan and Services ?  ?Discharge Planning Services: CM Consult ?           ?  ?  ?  ?  ?  ?  ?  ?  ?  ?  ? ?Social Determinants of Health (SDOH) Interventions ?  ? ? ?Readmission Risk Interventions ?   ? View : No data to display.  ?  ?  ?  ? ? ? ? ? ?

## 2022-04-21 NOTE — Progress Notes (Addendum)
Occupational Therapy Treatment ?Patient Details ?Name: Howard Best ?MRN: [id number] ?DOB: 08-18-1951 ?Today's Date: 04/21/2022 ? ? ?History of present illness Pt is a 71 y/o M presenting to [facility] on 4/28 from group home with multiple breakthrough seizures, then transferred to [facility] for further neurology workup. CT negative, EEG showing moderate diffuse encephalopathy. PMH includes intellectual disability, cerebral palsy, and seizures. ?  ?OT comments ? Pt making steady progress towards OT goals this session.  Session focus on BADL reeducation. Pt currently requires MAX A - total A +2 for bed level bathing and dressing. Pt able to roll R<>L for bathing tasks with MOD A. Pt following commands with increased time during ADLS. Pt would continue to benefit from skilled occupational therapy while admitted and after d/c to address the below listed limitations in order to improve overall functional mobility and facilitate independence with BADL participation. DC plan remains appropriate, will follow acutely per POC.  ? ?  ? ?Recommendations for follow up therapy are one component of a multi-disciplinary discharge planning process, led by the attending physician.  Recommendations may be updated based on patient status, additional functional criteria and insurance authorization. ?   ?Follow Up Recommendations ? Home health OT  ?  ?Assistance Recommended at Discharge Frequent or constant Supervision/Assistance  ?Patient can return home with the following ? Two people to help with walking and/or transfers;Two people to help with bathing/dressing/bathroom;Assistance with cooking/housework;Direct supervision/assist for medications management;Direct supervision/assist for financial management;Assistance with feeding;Assist for transportation;Help with stairs or ramp for entrance ?  ?Equipment Recommendations ?    ?  ?Recommendations for Other Services   ? ?  ?Precautions / Restrictions Precautions ?Precautions:  Fall ?Restrictions ?Weight Bearing Restrictions: No  ? ? ?  ? ?Mobility Bed Mobility ?Overal bed mobility: Needs Assistance ?Bed Mobility: Rolling ?Rolling: Mod assist (pt with preference to L side but able to roll R<>L with MOD A for bathing tasks) ?  ?  ?  ?  ?  ?  ? ?Transfers ?  ?  ?  ?  ?  ?  ?  ?  ?  ?General transfer comment: session focus on ADLs ?  ?  ?Balance   ?  ?  ?  ?  ?  ?  ?  ?  ?  ?  ?  ?  ?  ?  ?  ?  ?  ?  ?   ? ?ADL either performed or assessed with clinical judgement  ? ?ADL Overall ADL's : Needs assistance/impaired ?  ?  ?  ?  ?Upper Body Bathing: Maximal assistance;Bed level ?  ?Lower Body Bathing: Total assistance;Bed level ?  ?Upper Body Dressing : Maximal assistance;Bed level ?  ?Lower Body Dressing: Total assistance;Bed level ?  ?  ?  ?Toileting- Clothing Manipulation and Hygiene: Total assistance;Bed level ?  ?  ?  ?  ?General ADL Comments: pt requries MAX- total A +2 for bed level ADLs, pt reports his "family" helps him with bathing at home and that he "stands up" to shower ( unsure of validity of this statement) pt did participate in UB bathing by washing his stomach ?  ? 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  ?Lower Extremity Assessment ?Lower Extremity Assessment: Defer to PT evaluation (BLE contractures) ?  ?Cervical / Trunk Assessment ?Cervical / Trunk  Assessment: Other exceptions ?Cervical / Trunk Exceptions: scoliotic ?  ? ?Vision   ?Vision Assessment?: No apparent visual deficits (per gross assessment) ?  ?Perception Perception ?Perception: Not tested ?  ?Praxis Praxis ?Praxis: Impaired ?Praxis Impairment Details: Motor planning ?  ? ?Cognition Arousal/Alertness: Awake/alert ?Behavior During Therapy: Flat affect ?Overall  Cognitive Status: History of cognitive impairments - at baseline ?  ?  ?  ?  ?  ?  ?  ?  ?  ?  ?  ?  ?  ?  ?  ?  ?General Comments: aware of location and situation, follows commands with increased time. ?  ?  ?   ?Exercises   ? ?  ?Shoulder Instructions   ? ? ?  ?General Comments pillow placed in between knees to prevent skin breakdown; left pt in sidelying on R side as pt found on L side( pt with preference to lay on L side)  ? ? ?Pertinent Vitals/ Pain       Pain Assessment ?Pain Assessment: Faces ?Faces Pain Scale: No hurt ? ?Home Living   ?  ?  ?  ?  ?  ?  ?  ?  ?  ?  ?  ?  ?  ?  ?  ?  ?  ?  ? ?  ?Prior Functioning/Environment    ?  ?  ?  ?   ? ?Frequency ? Min 2X/week  ? ? ? ? ?  ?Progress Toward Goals ? ?OT Goals(current goals can now be found in the care plan section) ? Progress towards OT goals: Progressing toward goals (gradually) ? ?Acute Rehab OT Goals ?Patient Stated Goal: to DC today ?OT Goal Formulation: With patient ?Time For Goal Achievement: 05/03/22 ?Potential to Achieve Goals: Fair  ?Plan Discharge plan remains appropriate;Frequency remains appropriate   ? ?Co-evaluation ? ? ?   ?  ?  ?  ?  ? ?  ?AM-PAC OT "6 Clicks" Daily Activity     ?Outcome Measure ? ? Help from another person eating meals?: A Lot ?Help from another person taking care of personal grooming?: A Lot ?Help from another person toileting, which includes using toliet, bedpan, or urinal?: Total ?Help from another person bathing (including washing, rinsing, drying)?: Total ?Help from another person to put on and taking off regular upper body clothing?: Total ?Help from another person to put on and taking off regular lower body clothing?: Total ?6 Click Score: 8 ? ?  ?End of Session   ? ?OT Visit Diagnosis: Unsteadiness on feet (R26.81);Other abnormalities of gait and mobility (R26.89);Muscle weakness (generalized) (M62.81);Other symptoms and signs involving the nervous system (R29.898);Other symptoms and signs involving cognitive  function ?  ?Activity Tolerance Patient tolerated treatment well ?  ?Patient Left in bed;with call bell/phone within reach;with bed alarm set;with nursing/sitter in room ?  ?Nurse Communication Mobility status;Other (comment) (NT involved in session) ?  ? ?   ? ?Time: 1034-1100 ?OT Time Calculation (min): 26 min ? ?Charges: OT General Charges ?$OT Visit: 1 Visit ?OT Treatments ?$Self Care/Home Management : 23-37 mins ? ?Pollyann Glen K., COTA/L ?Acute Rehabilitation Services ?[phone] ? ? ?Barron Schmid ?04/21/2022, 11:27 AM ?

## 2022-06-13 ENCOUNTER — Ambulatory Visit (INDEPENDENT_AMBULATORY_CARE_PROVIDER_SITE_OTHER): Payer: Medicare Other | Admitting: Podiatry

## 2022-06-13 DIAGNOSIS — M79674 Pain in right toe(s): Secondary | ICD-10-CM | POA: Diagnosis not present

## 2022-06-13 DIAGNOSIS — M79675 Pain in left toe(s): Secondary | ICD-10-CM

## 2022-06-13 DIAGNOSIS — B351 Tinea unguium: Secondary | ICD-10-CM

## 2022-08-01 ENCOUNTER — Encounter: Payer: Self-pay | Admitting: Neurology

## 2022-08-01 ENCOUNTER — Ambulatory Visit (INDEPENDENT_AMBULATORY_CARE_PROVIDER_SITE_OTHER): Payer: Medicare Other | Admitting: Neurology

## 2022-08-01 VITALS — BP 121/74 | HR 79

## 2022-08-01 DIAGNOSIS — G40909 Epilepsy, unspecified, not intractable, without status epilepticus: Secondary | ICD-10-CM | POA: Diagnosis not present

## 2022-08-01 DIAGNOSIS — Z5181 Encounter for therapeutic drug level monitoring: Secondary | ICD-10-CM

## 2022-08-01 DIAGNOSIS — G809 Cerebral palsy, unspecified: Secondary | ICD-10-CM

## 2022-08-01 MED ORDER — LEVETIRACETAM 750 MG PO TABS: 1500.0000 mg | ORAL_TABLET | Freq: Two times a day (BID) | ORAL | 3 refills | Status: DC

## 2022-08-01 MED ORDER — LACOSAMIDE 100 MG PO TABS: 100.0000 mg | ORAL_TABLET | Freq: Two times a day (BID) | ORAL | 3 refills | Status: DC

## 2022-08-01 NOTE — Progress Notes (Signed)
[facility]  PATIENT: Howard Best DOB: 10-Dec-1951  REQUESTING CLINICIAN: Royals, Jenness Corner, MD HISTORY FROM: Chart review and Caregiver  REASON FOR VISIT: Seizure disorder    HISTORICAL  CHIEF COMPLAINT:  Chief Complaint  Patient presents with   New Patient (Initial Visit)    Room 17 w/ caregiver from facility. He resides at [facility] in [place]. Hospital follow up for seizures.    HISTORY OF PRESENT ILLNESS:  Mr. Howard Best is a 71 year old gentleman with past medical history of seizure disorder, cerebral palsy, wheelchair dependent, intellectual disability who was brought in to the clinic by caregiver for his diagnosis of seizures. Patient was admitted back in April for seizure cluster.  Per chart review there is report of patient having 1-2 seizures per month versus 1-2 seizures per year.  He was on gabapentin and Topamax but on the day of admission, he presented with a cluster of 6 seizures.  In the hospital his medication were switched to [place], his EEG did not show any acute seizures and since then he has been doing well.  Per caregiver, no additional seizures since being discharged, he is tolerating the medication very well.  Caregiver has been working with the patient for the past 4 months and has not witnessed any seizures. No other complaints    Hospital Summary  Mr. Howard Best is a 71 y.o. M with cerebral palsy, wheelchair dependent, congenital cognitive impairment and hx seizures who presented with seizures. Evidently, patient has occasional breakthrough seizures (1-2 per month by one report, 1-2 per year by another).  On the day of admission, patient had a cluster of 6 seizures and then was sleepy and unresponsive so EMS were called. In the hospital, neuraxial imaging unremarkable.  EEG normal.  No evidence of infection or electrolyte abnormality other than mild non-gap metabolic acidosis and mild hypokalemia (in the setting of stopping Topamax)  which corrected. His gabapentin and Topamax were stopped and he was transitioned to [place] and Vimpat.  He should have close Neurology follow up.   OTHER MEDICAL CONDITIONS: Cerebral Palsy, Seizure disorder, intellectual disability  REVIEW OF SYSTEMS: Full 14 system review of systems performed and negative with exception of:  Unable to fully obtain due to intellectual disability   ALLERGIES: No Known Allergies  HOME MEDICATIONS: Outpatient Medications Prior to Visit  Medication Sig Dispense Refill   acetaminophen (TYLENOL) 160 MG/5ML solution Take 20 mLs by mouth every 6 (six) hours as needed.     acetaminophen (TYLENOL) 325 MG tablet Take 650 mg by mouth every 4 (four) hours as needed.     alum & mag hydroxide-simeth (MAALOX/MYLANTA) 200-200-20 MG/5ML suspension Take 15 mLs by mouth as needed for indigestion or heartburn.     bisacodyl (FLEET) 10 MG/30ML ENEM Place 10 mg rectally as needed.     Brompheniramine-Pseudoeph (DIMETAPP PO) Take 20 mLs by mouth as needed.     Carbamide Peroxide-Saline (CLEARCANAL EAR WAX REMOVAL) 6.5 % KIT Place in ear(s) as needed.     chlorhexidine (PERIDEX) 0.12 % solution Use as directed 5 mLs in the mouth or throat at bedtime.     Cholecalciferol (VITAMIN D) 50 MCG (2000 UT) CAPS Take 2,000 Units by mouth daily.     diazePAM (VALTOCO 5 MG DOSE) 5 MG/0.1ML LIQD Place 1 spray into the nose daily as needed (seizure).     diazePAM, 15 MG Dose, (VALTOCO 15 MG DOSE) 2 x 7.5 MG/0.1ML LQPK Place 7.5 mg into the nose as directed.  As needed for seizure greater than or equal to 3 minutes. After 4 hours if seizure recurs, repeat dose. Max 1 episode eery 5 days. Alternate nostrils with each spray.     Diethyltoluamide (OFF ACTIVE) 15 % AERO Apply topically as needed.     diphenhydrAMINE (BENADRYL ALLERGY) 25 MG tablet Take 25 mg by mouth every 6 (six) hours as needed.     diphenhydrAMINE (BENADRYL) 12.5 MG/5ML liquid Take 10 mLs by mouth every 6 (six) hours as needed.      docusate sodium (COLACE) 100 MG capsule Take 100 mg by mouth daily.     DULoxetine (CYMBALTA) 30 MG capsule Take 30 mg by mouth daily.     guaiFENesin (ROBITUSSIN) 100 MG/5ML liquid Take 5 mLs by mouth every 4 (four) hours as needed for cough or to loosen phlegm.     hydrocortisone cream 1 % Apply 1 Application topically as needed for itching.     ibuprofen (ADVIL) 200 MG tablet Take 200-600 mg by mouth as needed.     ketoconazole (NIZORAL) 2 % shampoo Apply 1 application. topically every Monday, Wednesday, and Friday.     Lactobacillus (ACIDOPHILUS/BIFIDUS PO) Take 2 capsules by mouth daily.     lisinopril (ZESTRIL) 10 MG tablet Take 10 mg by mouth every evening.     loperamide (IMODIUM A-D) 2 MG tablet Take 4 mg by mouth as needed for diarrhea or loose stools.     LORazepam (ATIVAN) 1 MG tablet Take 1 mg by mouth as directed. Prior to dental procedures     magnesium hydroxide (MILK OF MAGNESIA) 400 MG/5ML suspension Take 30 mLs by mouth as needed for mild constipation.     mupirocin ointment (BACTROBAN) 2 % Apply 1 Application topically as needed.     neomycin-bacitracin-polymyxin (NEOSPORIN) OINT Apply 1 Application topically as needed for wound care.     polyethylene glycol (MIRALAX / GLYCOLAX) 17 g packet Take 17 g by mouth every other day.     promethazine (PHENERGAN) 12.5 MG tablet Take 12.5 mg by mouth every 12 (twelve) hours as needed for nausea or vomiting.     promethazine (PHENERGAN) 25 MG suppository Place 25 mg rectally every 6 (six) hours as needed for nausea or vomiting.     Vitamins A & D (VITAMIN A & D) ointment Apply 1 Application topically as needed for dry skin.     zinc oxide (BALMEX) 11.3 % CREA cream Apply 1 Application topically as needed.     Lacosamide 100 MG TABS Take 1 tablet (100 mg total) by mouth in the morning and at bedtime. 60 tablet 3   levETIRAcetam (KEPPRA) 750 MG tablet Take 2 tablets (1,500 mg total) by mouth 2 (two) times daily. 120 tablet 3    acetaminophen (TYLENOL) 500 MG tablet Take 500 mg by mouth every 6 (six) hours as needed for moderate pain.     No facility-administered medications prior to visit.    PAST MEDICAL HISTORY: Past Medical History:  Diagnosis Date   Cerebral palsy ([place])    Epilepsy ([place])    Intellectual disability    Major depressive disorder, single episode, unspecified     PAST SURGICAL HISTORY: History reviewed. No pertinent surgical history.  FAMILY HISTORY: History reviewed. No pertinent family history.  SOCIAL HISTORY: Social History   Socioeconomic History   Marital status: Single    Spouse name: Not on file   Number of children: Not on file   Years of education: Not on file  Highest education level: Not on file  Occupational History   Not on file  Tobacco Use   Smoking status: Never   Smokeless tobacco: Never  Substance and Sexual Activity   Alcohol use: Not Currently   Drug use: Not Currently   Sexual activity: Not Currently  Other Topics Concern   Not on file  Social History Narrative   Not on file   Social Determinants of Health   Financial Resource Strain: Not on file  Food Insecurity: Not on file  Transportation Needs: Not on file  Physical Activity: Not on file  Stress: Not on file  Social Connections: Not on file  Intimate Partner Violence: Not on file     PHYSICAL EXAM  GENERAL EXAM/CONSTITUTIONAL: Vitals:  Vitals:   08/01/22 1041  BP: 121/74  Pulse: 79   There is no height or weight on file to calculate BMI. Wt Readings from Last 3 Encounters:  04/16/22 150 lb 9.2 oz (68.3 kg)  03/22/22 180 lb (81.6 kg)   Patient is in no distress; well developed, nourished and groomed; neck is supple Able to follow simple commands  Upper extremities at least antigravity  Able to track examiner Wheelchair dependent     DIAGNOSTIC DATA (LABS, IMAGING, TESTING) - I reviewed patient records, labs, notes, testing and imaging myself where available.  Lab  Results  Component Value Date   WBC 7.1 04/20/2022   HGB 14.4 04/20/2022   HCT 41.0 04/20/2022   MCV 90.3 04/20/2022   PLT 233 04/20/2022      Component Value Date/Time   NA 141 04/20/2022 0318   K 3.8 04/20/2022 0318   CL 106 04/20/2022 0318   CO2 26 04/20/2022 0318   GLUCOSE 105 (H) 04/20/2022 0318   BUN <5 (L) 04/20/2022 0318   CREATININE 0.79 04/20/2022 0318   CALCIUM 9.3 04/20/2022 0318   GFRNONAA >60 04/20/2022 0318   GFRAA >60 09/10/2020 1715   No results found for: "CHOL", "HDL", "LDLCALC", "LDLDIRECT", "TRIG" No results found for: "HGBA1C" No results found for: "VITAMINB12"+ No results found for: "TSH"  LTM EEG 04/17/2022 ABNORMALITY - Continuous slow, generalized   IMPRESSION: This study is suggestive of moderate diffuse encephalopathy, nonspecific etiology. No seizures or epileptiform discharges were seen throughout the recording.   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 No acute abnormality is noted.  I personally reviewed brain Images and previous EEG reports.   ASSESSMENT AND PLAN  71 y.o. year old male  with with history of intellectual disability, cerebral palsy, seizure disorder who is presenting to establish care for his seizure disorder.  He is currently on Vimpat and Keppra, tolerating the medications well and denies any side effect.  No seizures since starting the medications.  At this point, I will check the level and continue patient on the same dose and same regimen.  I will see him in 1 year for follow-up or sooner if worse.  They do have a prescription for Valtoco, but he has not used it.   1. Seizure disorder ([place])   2. Cerebral palsy, unspecified type ([place])   3. Therapeutic drug monitoring     Patient Instructions  Continue with Keppra 1500 mg BID  Continue with Vimpat 100 mg BID  Will check a Keppra and Vimpat level today  Follow up in a year or sooner if worse     Per [facility] statutes, patients with seizures are not allowed to  drive until they have been seizure-free for six months.  Other recommendations include using caution when using heavy equipment or power tools. Avoid working on ladders or at heights. Take showers instead of baths.  Do not swim alone.  Ensure the water temperature is not too high on the home water heater. Do not go swimming alone. Do not lock yourself in a room alone (i.e. bathroom). When caring for infants or small children, sit down when holding, feeding, or changing them to minimize risk of injury to the child in the event you have a seizure. Maintain good sleep hygiene. Avoid alcohol.  Also recommend adequate sleep, hydration, good diet and minimize stress.   During the Seizure  - First, ensure adequate ventilation and place patients on the floor on their left side  Loosen clothing around the neck and ensure the airway is patent. If the patient is clenching the teeth, do not force the mouth open with any object as this can cause severe damage - Remove all items from the surrounding that can be hazardous. The patient may be oblivious to what's happening and may not even know what he or she is doing. If the patient is confused and wandering, either gently guide him/her away and block access to outside areas - Reassure the individual and be comforting - Call 911. In most cases, the seizure ends before EMS arrives. However, there are cases when seizures may last over 3 to 5 minutes. Or the individual may have developed breathing difficulties or severe injuries. If a pregnant patient or a person with diabetes develops a seizure, it is prudent to call an ambulance. - Finally, if the patient does not regain full consciousness, then call EMS. Most patients will remain confused for about 45 to 90 minutes after a seizure, so you must use judgment in calling for help. - Avoid restraints but make sure the patient is in a bed with  padded side rails - Place the individual in a lateral position with the neck slightly flexed; this will help the saliva drain from the mouth and prevent the tongue from falling backward - Remove all nearby furniture and other hazards from the area - Provide verbal assurance as the individual is regaining consciousness - Provide the patient with privacy if possible - Call for help and start treatment as ordered by the caregiver   After the Seizure (Postictal Stage)  After a seizure, most patients experience confusion, fatigue, muscle pain and/or a headache. Thus, one should permit the individual to sleep. For the next few days, reassurance is essential. Being calm and helping reorient the person is also of importance.  Most seizures are painless and end spontaneously. Seizures are not harmful to others but can lead to complications such as stress on the lungs, brain and the heart. Individuals with prior lung problems may develop labored breathing and respiratory distress.     Orders Placed This Encounter  Procedures   Levetiracetam level   Lacosamide    Meds ordered this encounter  Medications   Lacosamide 100 MG TABS    Sig: Take 1 tablet (100 mg total) by mouth in the morning and at bedtime.    Dispense:  180 tablet    Refill:  3   levETIRAcetam (KEPPRA) 750 MG tablet    Sig: Take 2 tablets (1,500 mg total) by mouth 2 (two) times daily.    Dispense:  360 tablet    Refill:  3    Return in about 1 year (around  08/02/2023).    Alric Ran, MD 08/01/2022, 4:52 PM  [facility] [address] [place], [place] [address] [phone]

## 2022-08-01 NOTE — Patient Instructions (Signed)
Continue with Keppra 1500 mg BID  Continue with Vimpat 100 mg BID  Will check a Keppra and Vimpat level today  Follow up in a year or sooner if worse

## 2022-08-03 ENCOUNTER — Telehealth: Payer: Self-pay | Admitting: *Deleted

## 2022-08-03 LAB — LACOSAMIDE: Lacosamide: 8.5 ug/mL (ref 5.0–10.0)

## 2022-08-03 LAB — LEVETIRACETAM LEVEL: Levetiracetam Lvl: 79.4 ug/mL — ABNORMAL HIGH (ref 10.0–40.0)

## 2022-08-03 NOTE — Telephone Encounter (Signed)
I called [facility] at [phone]. Transferred to the nursing station and spoke to [place]. Provided her with the lab results. She verbalized understanding.

## 2022-08-03 NOTE — Telephone Encounter (Signed)
-----   Message from Windell Norfolk, MD sent at 08/03/2022  3:59 PM EDT ----- Please call and advise the patient that the recent Keppra level was elevated but no need to decrease the dose. Vimpat level was within normal limits.  No further action is required on these tests at this time. Please remind patient to keep any upcoming appointments or tests and to call us with any interim questions, concerns, problems or updates. Thanks,   Windell Norfolk, MD

## 2022-08-03 NOTE — Progress Notes (Signed)
Please call and advise the patient that the recent Keppra level was elevated but no need to decrease the dose. Vimpat level was within normal limits.  No further action is required on these tests at this time. Please remind patient to keep any upcoming appointments or tests and to call us with any interim questions, concerns, problems or updates. Thanks,   Windell Norfolk, MD

## 2023-03-26 ENCOUNTER — Emergency Department (HOSPITAL_COMMUNITY): Payer: Medicare Other

## 2023-03-26 ENCOUNTER — Encounter (HOSPITAL_COMMUNITY): Payer: Self-pay

## 2023-03-26 ENCOUNTER — Emergency Department (HOSPITAL_COMMUNITY)
Admission: EM | Admit: 2023-03-26 | Discharge: 2023-03-26 | Disposition: A | Discharge status: Home / Self Care | Payer: Medicare Other | Attending: Emergency Medicine | Admitting: Emergency Medicine

## 2023-03-26 ENCOUNTER — Other Ambulatory Visit: Payer: Self-pay

## 2023-03-26 DIAGNOSIS — R569 Unspecified convulsions: Secondary | ICD-10-CM

## 2023-03-26 DIAGNOSIS — G40909 Epilepsy, unspecified, not intractable, without status epilepticus: Secondary | ICD-10-CM | POA: Diagnosis present

## 2023-03-26 DIAGNOSIS — Z79899 Other long term (current) drug therapy: Secondary | ICD-10-CM | POA: Insufficient documentation

## 2023-03-26 LAB — CBC
HCT: 50.5 % (ref 39.0–52.0)
Hemoglobin: 16.9 g/dL (ref 13.0–17.0)
MCH: 31.7 pg (ref 26.0–34.0)
MCHC: 33.5 g/dL (ref 30.0–36.0)
MCV: 94.7 fL (ref 80.0–100.0)
Platelets: 275 10*3/uL (ref 150–400)
RBC: 5.33 MIL/uL (ref 4.22–5.81)
RDW: 12.7 % (ref 11.5–15.5)
WBC: 8.5 10*3/uL (ref 4.0–10.5)
nRBC: 0 % (ref 0.0–0.2)

## 2023-03-26 LAB — COMPREHENSIVE METABOLIC PANEL
ALT: 15 U/L (ref 0–44)
AST: 21 U/L (ref 15–41)
Albumin: 4.9 g/dL (ref 3.5–5.0)
Alkaline Phosphatase: 84 U/L (ref 38–126)
Anion gap: 9 (ref 5–15)
BUN: 16 mg/dL (ref 8–23)
CO2: 29 mmol/L (ref 22–32)
Calcium: 9.6 mg/dL (ref 8.9–10.3)
Chloride: 100 mmol/L (ref 98–111)
Creatinine, Ser: 0.93 mg/dL (ref 0.61–1.24)
GFR, Estimated: >60 mL/min (ref >60)
Glucose, Bld: 97 mg/dL (ref 70–99)
Potassium: 4 mmol/L (ref 3.5–5.1)
Sodium: 138 mmol/L (ref 135–145)
Total Bilirubin: 0.7 mg/dL (ref 0.3–1.2)
Total Protein: 8.5 g/dL — ABNORMAL HIGH (ref 6.5–8.1)

## 2023-03-26 MED ORDER — LACOSAMIDE 100 MG PO TABS: 150.0000 mg | ORAL_TABLET | Freq: Two times a day (BID) | ORAL | 3 refills | Status: DC

## 2023-03-26 MED ORDER — SODIUM CHLORIDE 0.9 % IV SOLN
100.0000 mg | Freq: Once | INTRAVENOUS | Status: AC
  Administered 2023-03-26: 100 mg via INTRAVENOUS
  Filled 2023-03-26: qty 10

## 2023-03-26 NOTE — ED Notes (Signed)
Pt discharged home. A&Ox3, baseline. Discharge instructions discussed and given to caregiver. No seizure activity observed during discharge.

## 2023-03-26 NOTE — Discharge Instructions (Signed)
Increase the lacosamide to 150 mg twice a day.  Follow-up with his Neurologist

## 2023-03-26 NOTE — ED Triage Notes (Addendum)
Per EMS, pt coming from  [facility] group home. Since 0900 pt has had about 6-7 seizures. Pt has given 15mg  diazepam at 1208. Pt baseline A&O 3x. Pt complaining of neck pain, no signs of trauma or bruising.

## 2023-03-26 NOTE — ED Provider Notes (Signed)
[place] EMERGENCY DEPARTMENT AT [facility] Provider Note   CSN: [id number] Arrival date & time: 03/26/23  1250     History  Chief Complaint  Patient presents with   Seizures    Howard Best is a 72 y.o. male.   Seizures Patient presents with seizures.  History of same.  Is at group home at baseline with history of cerebral palsy and seizures.  Reportedly had 6 or 7 seizures today.  Caregiver cannot quite tell me when his last seizure was.  States seizures today were clearly tightening up and turning red although states he has had some episode in the past where he has behavioral episodes where he gets excited.  Was given Valium at 1208.  States had another episode after that.  Now decreased responsiveness.  Appears to be moving extremities but does not really want to communicate.  Caregiver states he does this sometimes.  Reviewing notes from neurology appears to be on Keppra at 1500 and Vimpat at 100 both twice a day.  Has reportedly been compliant with medications.    Past Medical History:  Diagnosis Date   Cerebral palsy    Epilepsy    Intellectual disability    Major depressive disorder, single episode, unspecified     Home Medications Prior to Admission medications   Medication Sig Start Date End Date Taking? Authorizing Provider  acetaminophen (TYLENOL) 160 MG/5ML solution Take 20 mLs by mouth every 6 (six) hours as needed.    [provider]  acetaminophen (TYLENOL) 325 MG tablet Take 650 mg by mouth every 4 (four) hours as needed.    [provider]  alum & mag hydroxide-simeth (MAALOX/MYLANTA) 200-200-20 MG/5ML suspension Take 15 mLs by mouth as needed for indigestion or heartburn.    [provider]  bisacodyl (FLEET) 10 MG/30ML ENEM Place 10 mg rectally as needed.    [provider]  Brompheniramine-Pseudoeph (DIMETAPP PO) Take 20 mLs by mouth as needed.    [provider]  Carbamide Peroxide-Saline  (CLEARCANAL EAR WAX REMOVAL) 6.5 % KIT Place in ear(s) as needed.    [provider]  chlorhexidine (PERIDEX) 0.12 % solution Use as directed 5 mLs in the mouth or throat at bedtime.    [provider]  Cholecalciferol (VITAMIN D) 50 MCG (2000 UT) CAPS Take 2,000 Units by mouth daily.    [provider]  diazePAM (VALTOCO 5 MG DOSE) 5 MG/0.1ML LIQD Place 1 spray into the nose daily as needed (seizure).    [provider]  diazePAM, 15 MG Dose, (VALTOCO 15 MG DOSE) 2 x 7.5 MG/0.1ML LQPK Place 7.5 mg into the nose as directed. As needed for seizure greater than or equal to 3 minutes. After 4 hours if seizure recurs, repeat dose. Max 1 episode eery 5 days. Alternate nostrils with each spray.    [provider]  Diethyltoluamide (OFF ACTIVE) 15 % AERO Apply topically as needed.    [provider]  diphenhydrAMINE (BENADRYL ALLERGY) 25 MG tablet Take 25 mg by mouth every 6 (six) hours as needed.    [provider]  diphenhydrAMINE (BENADRYL) 12.5 MG/5ML liquid Take 10 mLs by mouth every 6 (six) hours as needed.    [provider]  docusate sodium (COLACE) 100 MG capsule Take 100 mg by mouth daily.    [provider]  DULoxetine (CYMBALTA) 30 MG capsule Take 30 mg by mouth daily.    [provider]  guaiFENesin (ROBITUSSIN) 100 MG/5ML  liquid Take 5 mLs by mouth every 4 (four) hours as needed for cough or to loosen phlegm.    [provider]  hydrocortisone cream 1 % Apply 1 Application topically as needed for itching.    [provider]  ibuprofen (ADVIL) 200 MG tablet Take 200-600 mg by mouth as needed.    [provider]  ketoconazole (NIZORAL) 2 % shampoo Apply 1 application. topically every Monday, Wednesday, and Friday.    [provider]  Lacosamide 100 MG TABS Take 1.5 tablets (150 mg total) by mouth in the morning and at bedtime. 03/26/23 03/20/24  Benjiman Core, MD   Lactobacillus (ACIDOPHILUS/BIFIDUS PO) Take 2 capsules by mouth daily.    [provider]  levETIRAcetam (KEPPRA) 750 MG tablet Take 2 tablets (1,500 mg total) by mouth 2 (two) times daily. 08/01/22 07/27/23  Windell Norfolk, MD  lisinopril (ZESTRIL) 10 MG tablet Take 10 mg by mouth every evening.    [provider]  loperamide (IMODIUM A-D) 2 MG tablet Take 4 mg by mouth as needed for diarrhea or loose stools.    [provider]  LORazepam (ATIVAN) 1 MG tablet Take 1 mg by mouth as directed. Prior to dental procedures    [provider]  magnesium hydroxide (MILK OF MAGNESIA) 400 MG/5ML suspension Take 30 mLs by mouth as needed for mild constipation.    [provider]  mupirocin ointment (BACTROBAN) 2 % Apply 1 Application topically as needed.    [provider]  neomycin-bacitracin-polymyxin (NEOSPORIN) OINT Apply 1 Application topically as needed for wound care.    [provider]  polyethylene glycol (MIRALAX / GLYCOLAX) 17 g packet Take 17 g by mouth every other day.    [provider]  promethazine (PHENERGAN) 12.5 MG tablet Take 12.5 mg by mouth every 12 (twelve) hours as needed for nausea or vomiting.    [provider]  promethazine (PHENERGAN) 25 MG suppository Place 25 mg rectally every 6 (six) hours as needed for nausea or vomiting.    [provider]  Vitamins A & D (VITAMIN A & D) ointment Apply 1 Application topically as needed for dry skin.    [provider]  zinc oxide (BALMEX) 11.3 % CREA cream Apply 1 Application topically as needed.    [provider]      Allergies    Patient has no known allergies.    Review of Systems   Review of Systems  Neurological:  Positive for seizures.    Physical Exam Updated Vital Signs BP 128/66   Pulse 70   Temp (!) 97.4 F (36.3 C) (Oral)   Resp 16   SpO2 98%  Physical Exam Vitals and nursing note reviewed.  Eyes:      General: No scleral icterus. Cardiovascular:     Rate and Rhythm: Regular rhythm.  Pulmonary:     Breath sounds: No wheezing or rhonchi.  Abdominal:     Tenderness: There is no abdominal tenderness.  Neurological:     Comments: Lying on his side in fetal position.  No tremulousness.  Will look around.  However will not speak or follow commands.     ED Results / Procedures / Treatments   Labs (all labs ordered are listed, but only abnormal results are displayed) Labs Reviewed  COMPREHENSIVE METABOLIC PANEL - Abnormal; Notable for the following components:      Result Value   Total Protein 8.5 (*)    All other components within  normal limits  CBC  LEVETIRACETAM LEVEL  LACOSAMIDE    EKG None  Radiology CT HEAD WO CONTRAST (5MM)  Result Date: 03/26/2023 CLINICAL DATA:  Seizure disorder.  Altered mental status. EXAM: CT HEAD WITHOUT CONTRAST TECHNIQUE: Contiguous axial images were obtained from the base of the skull through the vertex without intravenous contrast. RADIATION DOSE REDUCTION: This exam was performed according to the departmental dose-optimization program which includes automated exposure control, adjustment of the mA and/or kV according to patient size and/or use of iterative reconstruction technique. COMPARISON:  04/15/2022 FINDINGS: Brain: No evidence of intracranial hemorrhage, acute infarction, extra-axial collection, or mass lesion/mass effect. Severe dilatation of the atria and occipital horns of both lateral ventricles is unchanged, consistent with colpocephaly. Left frontal lobe encephalomalacia again noted. 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 Vascular:  No hyperdense vessel or other acute findings. Skull: No evidence of fracture or other significant bone abnormality. Sinuses/Orbits:  No acute findings. Other: None. IMPRESSION: No acute intracranial abnormality. Stable severe colpocephaly. 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 Electronically Signed   By: Danae OrleansJohn A Stahl M.D.   On: 03/26/2023 14:51    Procedures Procedures    Medications Ordered in ED Medications  lacosamide (VIMPAT) 100 mg in sodium chloride 0.9 % 25 mL IVPB (0 mg Intravenous Stopped 03/26/23 1515)    ED Course/ Medical Decision Making/ A&P                             Medical Decision Making Amount and/or Complexity of Data Reviewed Labs: ordered. Radiology: ordered.  Risk Prescription drug management.   Patient with seizure cluster.  History of same.  Reportedly has been taking his medicine.  Does not appear to be actively seizing at this time and got 50 mg of nasal Valium.  Discussed with Dr. Amada JupiterKirkpatrick from neurology.  Will increase Vimpat to 150 twice a day will give an IV load of 100 here.  If after more monitoring here does not have seizures potentially could be discharged back home.  If has another seizure would require admission to hospital but [facility].  Lab work reassuring.  Patient back at baseline.  Will discharge home with increase in Vimpat.        Final Clinical Impression(s) / ED Diagnoses Final diagnoses:  Seizure    Rx / DC Orders ED Discharge Orders          Ordered    Lacosamide 100 MG TABS  2 times daily        03/26/23 1707              Benjiman CorePickering, Yuma Pacella, MD 03/26/23 1724

## 2023-03-28 LAB — LEVETIRACETAM LEVEL: Levetiracetam Lvl: 70.2 ug/mL — ABNORMAL HIGH (ref 10.0–40.0)

## 2023-03-30 LAB — LACOSAMIDE: Lacosamide: 7.8 ug/mL (ref 5.0–10.0)

## 2023-04-14 ENCOUNTER — Encounter (HOSPITAL_COMMUNITY): Payer: Self-pay

## 2023-04-14 ENCOUNTER — Emergency Department (HOSPITAL_COMMUNITY)
Admission: EM | Admit: 2023-04-14 | Discharge: 2023-04-14 | Disposition: A | Discharge status: Home / Self Care | Payer: Medicare Other | Attending: Emergency Medicine | Admitting: Emergency Medicine

## 2023-04-14 DIAGNOSIS — R569 Unspecified convulsions: Secondary | ICD-10-CM | POA: Diagnosis present

## 2023-04-14 DIAGNOSIS — G40909 Epilepsy, unspecified, not intractable, without status epilepticus: Secondary | ICD-10-CM | POA: Diagnosis not present

## 2023-04-14 LAB — CBG MONITORING, ED: Glucose-Capillary: 93 mg/dL (ref 70–99)

## 2023-04-14 MED ORDER — LACOSAMIDE 50 MG PO TABS
200.0000 mg | ORAL_TABLET | Freq: Once | ORAL | Status: AC
  Administered 2023-04-14: 200 mg via ORAL
  Filled 2023-04-14: qty 4

## 2023-04-14 NOTE — ED Provider Notes (Signed)
[place] EMERGENCY DEPARTMENT AT [facility] Provider Note   CSN: [id number] Arrival date & time: 04/14/23  0800     History  Chief Complaint  Patient presents with   Seizures    Howard Best is a 72 y.o. male.  HPI Patient lives at a group home.  He has known history of seizure disorder.  Patient is treated with Vimpat and Keppra.  Patient's caregiver at bedside reports that he has consistently been getting his medications.  This morning at about 715 he started getting back-to-back seizures and had 3 seizures within a few minutes.  EMS was called.  EMS administered intranasal diazepam and seizures stopped.  Patient does not have any immediate complaints.  Patient's caregiver at bedside reports that yesterday he did not seem interested in lunch but ate dinner.  He seemed a little less active and interactive than usual but no other symptoms.    Home Medications Prior to Admission medications   Medication Sig Start Date End Date Taking? Authorizing Provider  acetaminophen (TYLENOL) 160 MG/5ML solution Take 20 mLs by mouth every 6 (six) hours as needed.    [provider]  acetaminophen (TYLENOL) 325 MG tablet Take 650 mg by mouth every 4 (four) hours as needed.    [provider]  alum & mag hydroxide-simeth (MAALOX/MYLANTA) 200-200-20 MG/5ML suspension Take 15 mLs by mouth as needed for indigestion or heartburn.    [provider]  bisacodyl (FLEET) 10 MG/30ML ENEM Place 10 mg rectally as needed.    [provider]  Brompheniramine-Pseudoeph (DIMETAPP PO) Take 20 mLs by mouth as needed.    [provider]  Carbamide Peroxide-Saline (CLEARCANAL EAR WAX REMOVAL) 6.5 % KIT Place in ear(s) as needed.    [provider]  chlorhexidine (PERIDEX) 0.12 % solution Use as directed 5 mLs in the mouth or throat at bedtime.    [provider]  Cholecalciferol (VITAMIN D) 50 MCG (2000 UT) CAPS Take 2,000 Units by mouth  daily.    [provider]  diazePAM (VALTOCO 5 MG DOSE) 5 MG/0.1ML LIQD Place 1 spray into the nose daily as needed (seizure).    [provider]  diazePAM, 15 MG Dose, (VALTOCO 15 MG DOSE) 2 x 7.5 MG/0.1ML LQPK Place 7.5 mg into the nose as directed. As needed for seizure greater than or equal to 3 minutes. After 4 hours if seizure recurs, repeat dose. Max 1 episode eery 5 days. Alternate nostrils with each spray.    [provider]  Diethyltoluamide (OFF ACTIVE) 15 % AERO Apply topically as needed.    [provider]  diphenhydrAMINE (BENADRYL ALLERGY) 25 MG tablet Take 25 mg by mouth every 6 (six) hours as needed.    [provider]  diphenhydrAMINE (BENADRYL) 12.5 MG/5ML liquid Take 10 mLs by mouth every 6 (six) hours as needed.    [provider]  docusate sodium (COLACE) 100 MG capsule Take 100 mg by mouth daily.    [provider]  DULoxetine (CYMBALTA) 30 MG capsule Take 30 mg by mouth daily.    [provider]  guaiFENesin (ROBITUSSIN) 100 MG/5ML liquid Take 5 mLs by mouth every 4 (four) hours as needed for cough or to loosen phlegm.    [provider]  hydrocortisone cream 1 % Apply 1 Application topically as needed for itching.    [provider]  ibuprofen (ADVIL) 200 MG tablet Take 200-600 mg by mouth as needed.    [provider]  ketoconazole (NIZORAL) 2 % shampoo Apply 1 application. topically every Monday, Wednesday, and Friday.    [provider]  Lacosamide 100 MG TABS Take 1.5 tablets (150 mg total) by mouth in the morning and at bedtime. 03/26/23 03/20/24  Benjiman Core, MD  Lactobacillus (ACIDOPHILUS/BIFIDUS PO) Take 2 capsules by mouth daily.    [provider]  levETIRAcetam (KEPPRA) 750 MG tablet Take 2 tablets (1,500 mg total) by mouth 2 (two) times daily. 08/01/22 07/27/23  Windell Norfolk, MD  lisinopril (ZESTRIL) 10 MG tablet Take 10 mg by mouth every evening.     [provider]  loperamide (IMODIUM A-D) 2 MG tablet Take 4 mg by mouth as needed for diarrhea or loose stools.    [provider]  LORazepam (ATIVAN) 1 MG tablet Take 1 mg by mouth as directed. Prior to dental procedures    [provider]  magnesium hydroxide (MILK OF MAGNESIA) 400 MG/5ML suspension Take 30 mLs by mouth as needed for mild constipation.    [provider]  mupirocin ointment (BACTROBAN) 2 % Apply 1 Application topically as needed.    [provider]  neomycin-bacitracin-polymyxin (NEOSPORIN) OINT Apply 1 Application topically as needed for wound care.    [provider]  polyethylene glycol (MIRALAX / GLYCOLAX) 17 g packet Take 17 g by mouth every other day.    [provider]  promethazine (PHENERGAN) 12.5 MG tablet Take 12.5 mg by mouth every 12 (twelve) hours as needed for nausea or vomiting.    [provider]  promethazine (PHENERGAN) 25 MG suppository Place 25 mg rectally every 6 (six) hours as needed for nausea or vomiting.    [provider]  Vitamins A & D (VITAMIN A & D) ointment Apply 1 Application topically as needed for dry skin.    [provider]  zinc oxide (BALMEX) 11.3 % CREA cream Apply 1 Application topically as needed.    [provider]      Allergies    Patient has no known allergies.    Review of Systems   Review of Systems  Physical Exam Updated Vital Signs BP (!) 98/58   Pulse 69   Temp 98.5 F (36.9 C) (Oral)   Resp 11   Ht 5\' 6"  (1.676 m)   Wt 68 kg   SpO2 98%   BMI 24.21 kg/m  Physical Exam Constitutional:      Comments: Patient is awake.  No respiratory distress.  He has physical contractures and muscular atrophy consistent with his baseline cerebral palsy.  HENT:     Mouth/Throat:     Pharynx: Oropharynx is clear.     Comments: Airway is clear.  Speech is slightly difficult to understand but not hoarse or stridulous. Eyes:      Extraocular Movements: Extraocular movements intact.  Cardiovascular:     Rate and Rhythm: Normal rate and regular rhythm.  Pulmonary:     Effort: Pulmonary effort is normal.     Breath sounds: Normal breath sounds.  Abdominal:     General: There is no distension.     Palpations: Abdomen is soft.     Tenderness: There is no abdominal tenderness.  Musculoskeletal:     Comments: Patient holds extremities mostly in contracture but he is able to move and reports he does ambulate some at baseline at the group home.  No evident deformities or specific areas of any pain.  Skin:    General: Skin is warm  and dry.  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  He will respond with verbal answers.  Sometimes difficult to understand but situationally appropriate.  Appears to have chronic baseline motor limitations based on cerebral palsy and contractures but does have some motor function.  Certainly no areas of flaccid paralysis.  Appears to be at baseline.     ED Results / Procedures / Treatments   Labs (all labs ordered are listed, but only abnormal results are displayed) Labs Reviewed  CBG MONITORING, ED    EKG EKG Interpretation  Date/Time:  Friday April 14 2023 08:10:38 EDT Ventricular Rate:  86 PR Interval:  181 QRS Duration: 98 QT Interval:  354 QTC Calculation: 424 R Axis:   59 Text Interpretation: Sinus rhythm S1,S2,S3 pattern Minimal ST elevation, lateral leads no change from previoous Confirmed by Arby Barrette [phone]) on 04/14/2023 9:01:28 AM  Radiology No results found.  Procedures Procedures    Medications Ordered in ED Medications  lacosamide (VIMPAT) tablet 200 mg (200 mg Oral Given 04/14/23 1056)    ED Course/ Medical Decision Making/ A&P                             Medical Decision Making Risk Prescription drug management.   Patient presents for sequential seizures.  He has pre-existing seizure  disorder treated with Keppra and Vimpat.  Periodically he does have breakthrough seizures.  At this time there does not appear to be a real precipitating factor.  Patient lives in a group home and medications are administered.  Patient's caregivers at bedside reports are no missed doses.  No significant recent changes in baseline function although yesterday did not seem to want to eat lunch but ate dinner.  Patient is not ill in appearance.  He is not febrile or tachycardic.  Seizures were abated by intranasal diazepam.  Caregiver reports there was no associated trauma.  Patient was well protected.  At this time I do not feel that further diagnostic imaging is indicated.   Patient does not appear septic or to have any evident reason for interim metabolic derangement.  Labs were checked approximately 2 weeks ago with normal metabolic panel and normal CBC.  Consult: Reviewed with Dr. Selina Cooley neurology.  She advises we can increase the Vimpat to 200 mg twice daily.  Vimpat 50 mg orally administered emergency department to achieve 200 mg dose as report is patient has had his a.m. 150 mg dose.  Patient has not had any recurrence of seizure activity while in the emergency department.  He is at baseline.  At this time I feel stable for discharge back to group home with supervision and return if any concerns or development of recurrent seizures.         Final Clinical Impression(s) / ED Diagnoses Final diagnoses:  Seizure disorder [facility])    Rx / DC Orders ED Discharge Orders     None         Arby Barrette, MD 04/14/23 1119

## 2023-04-14 NOTE — ED Triage Notes (Signed)
Pt BIBA from [facility] group home. Pt has hx of seizures- had a few today back to back around 0715. Pt received IN diazapem- seizures stopped prior to EMS arrival. Pt post ictal on EMS arrival.   100 HR 95% RA 96 CBG 154/86

## 2023-04-14 NOTE — Discharge Instructions (Signed)
1.  Continue your current Keppra dose.  Increase your Vimpat (lacosamide) to 200 mg twice a day. 2.  Follow-up with your neurologist for recheck this soon as possible.

## 2023-06-20 ENCOUNTER — Emergency Department (HOSPITAL_COMMUNITY)
Admission: EM | Admit: 2023-06-20 | Discharge: 2023-06-20 | Disposition: A | Discharge status: Home / Self Care | Payer: Medicare Other | Attending: Emergency Medicine | Admitting: Emergency Medicine

## 2023-06-20 ENCOUNTER — Emergency Department (HOSPITAL_COMMUNITY): Payer: Medicare Other

## 2023-06-20 ENCOUNTER — Other Ambulatory Visit: Payer: Self-pay

## 2023-06-20 DIAGNOSIS — W050XXA Fall from non-moving wheelchair, initial encounter: Secondary | ICD-10-CM | POA: Insufficient documentation

## 2023-06-20 DIAGNOSIS — W19XXXA Unspecified fall, initial encounter: Secondary | ICD-10-CM

## 2023-06-20 DIAGNOSIS — F0390 Unspecified dementia without behavioral disturbance: Secondary | ICD-10-CM | POA: Insufficient documentation

## 2023-06-20 DIAGNOSIS — Z043 Encounter for examination and observation following other accident: Secondary | ICD-10-CM | POA: Insufficient documentation

## 2023-06-20 LAB — CBC WITH DIFFERENTIAL/PLATELET
Abs Immature Granulocytes: 0.04 10*3/uL (ref 0.00–0.07)
Basophils Absolute: 0.1 10*3/uL (ref 0.0–0.1)
Basophils Relative: 1 %
Eosinophils Absolute: 0.3 10*3/uL (ref 0.0–0.5)
Eosinophils Relative: 3 %
HCT: 48.7 % (ref 39.0–52.0)
Hemoglobin: 15.8 g/dL (ref 13.0–17.0)
Immature Granulocytes: 0 %
Lymphocytes Relative: 18 %
Lymphs Abs: 2.2 10*3/uL (ref 0.7–4.0)
MCH: 31.7 pg (ref 26.0–34.0)
MCHC: 32.4 g/dL (ref 30.0–36.0)
MCV: 97.6 fL (ref 80.0–100.0)
Monocytes Absolute: 1.6 10*3/uL — ABNORMAL HIGH (ref 0.1–1.0)
Monocytes Relative: 13 %
Neutro Abs: 7.8 10*3/uL — ABNORMAL HIGH (ref 1.7–7.7)
Neutrophils Relative %: 65 %
Platelets: 255 10*3/uL (ref 150–400)
RBC: 4.99 MIL/uL (ref 4.22–5.81)
RDW: 12.6 % (ref 11.5–15.5)
WBC: 12 10*3/uL — ABNORMAL HIGH (ref 4.0–10.5)
nRBC: 0 % (ref 0.0–0.2)

## 2023-06-20 LAB — URINALYSIS, ROUTINE W REFLEX MICROSCOPIC
Bilirubin Urine: NEGATIVE
Glucose, UA: NEGATIVE mg/dL
Hgb urine dipstick: NEGATIVE
Ketones, ur: NEGATIVE mg/dL
Nitrite: NEGATIVE
Protein, ur: NEGATIVE mg/dL
Specific Gravity, Urine: 1.015 (ref 1.005–1.030)
pH: 5 (ref 5.0–8.0)

## 2023-06-20 LAB — COMPREHENSIVE METABOLIC PANEL
ALT: 13 U/L (ref 0–44)
AST: 21 U/L (ref 15–41)
Albumin: 3.9 g/dL (ref 3.5–5.0)
Alkaline Phosphatase: 63 U/L (ref 38–126)
Anion gap: 18 — ABNORMAL HIGH (ref 5–15)
BUN: 13 mg/dL (ref 8–23)
CO2: 21 mmol/L — ABNORMAL LOW (ref 22–32)
Calcium: 9.3 mg/dL (ref 8.9–10.3)
Chloride: 100 mmol/L (ref 98–111)
Creatinine, Ser: 0.81 mg/dL (ref 0.61–1.24)
GFR, Estimated: >60 mL/min (ref >60)
Glucose, Bld: 77 mg/dL (ref 70–99)
Potassium: 4.4 mmol/L (ref 3.5–5.1)
Sodium: 139 mmol/L (ref 135–145)
Total Bilirubin: 0.6 mg/dL (ref 0.3–1.2)
Total Protein: 6.9 g/dL (ref 6.5–8.1)

## 2023-06-20 NOTE — ED Notes (Signed)
Provider at bedside

## 2023-06-20 NOTE — ED Triage Notes (Signed)
BIB EMS from group home. Went to dentist today to have tooth pulled and did have anesthesia. Pt fell out of wheelchair and head was under table. Fall was unwitnessed so unsure if pt hit head and also unsure if pt had a seizure. Pt baseline has a slow speech. Pt has been groggy since returning from dentist. Pt had c collar on from ems.

## 2023-06-20 NOTE — ED Notes (Signed)
Condom cath applied. New diaper applied as pt was in 2 from facility that were full of urine.

## 2023-06-20 NOTE — ED Triage Notes (Signed)
Pt arrived with 2 briefs on soiled in urine. Pt clean and dry brief placed on .

## 2023-06-20 NOTE — Discharge Instructions (Signed)
Mr. Rhoton presented to the ED after an unwitnessed fall. CT of the head and neck showed no fracture. Labs showed in an increased WBC, though urinalysis showed no acute abnormalities, and CXR showed possible pulmonary infiltrate, after a shared medical decision making discussion with patient and his healthcare team we have decided to discharge the patient. Please follow up with PCP in the next week.

## 2023-06-20 NOTE — ED Provider Notes (Signed)
[facility] AT [facility] Provider Note   CSN: [id number] Arrival date & time: 06/20/23  1607     History  Chief Complaint  Patient presents with   Howard Best is a 72 y.o. male.   Fall  Patient with history of CP, seizure disorder was brought to ED by EMS from group home. Patient was at the dentist to have a tooth pulled and had anesthesia earlier in the day. Unknown if seizure accompanied fall. On exam, patient is groggy with incoherent speech, aide states patient is usually active in his wheelchair with somewhat understandable speech.      Home Medications Prior to Admission medications   Medication Sig Start Date End Date Taking? Authorizing Provider  acetaminophen (TYLENOL) 160 MG/5ML solution Take 20 mLs by mouth every 6 (six) hours as needed.    [provider]  acetaminophen (TYLENOL) 325 MG tablet Take 650 mg by mouth every 4 (four) hours as needed.    [provider]  alum & mag hydroxide-simeth (MAALOX/MYLANTA) 200-200-20 MG/5ML suspension Take 15 mLs by mouth as needed for indigestion or heartburn.    [provider]  bisacodyl (FLEET) 10 MG/30ML ENEM Place 10 mg rectally as needed.    [provider]  Brompheniramine-Pseudoeph (DIMETAPP PO) Take 20 mLs by mouth as needed.    [provider]  Carbamide Peroxide-Saline (CLEARCANAL EAR WAX REMOVAL) 6.5 % KIT Place in ear(s) as needed.    [provider]  chlorhexidine (PERIDEX) 0.12 % solution Use as directed 5 mLs in the mouth or throat at bedtime.    [provider]  Cholecalciferol (VITAMIN D) 50 MCG (2000 UT) CAPS Take 2,000 Units by mouth daily.    [provider]  diazePAM (VALTOCO 5 MG DOSE) 5 MG/0.1ML LIQD Place 1 spray into the nose daily as needed (seizure).    [provider]  diazePAM, 15 MG Dose, (VALTOCO 15 MG DOSE) 2 x 7.5 MG/0.1ML LQPK Place 7.5 mg into the nose as directed. As  needed for seizure greater than or equal to 3 minutes. After 4 hours if seizure recurs, repeat dose. Max 1 episode eery 5 days. Alternate nostrils with each spray.    [provider]  Diethyltoluamide (OFF ACTIVE) 15 % AERO Apply topically as needed.    [provider]  diphenhydrAMINE (BENADRYL ALLERGY) 25 MG tablet Take 25 mg by mouth every 6 (six) hours as needed.    [provider]  diphenhydrAMINE (BENADRYL) 12.5 MG/5ML liquid Take 10 mLs by mouth every 6 (six) hours as needed.    [provider]  docusate sodium (COLACE) 100 MG capsule Take 100 mg by mouth daily.    [provider]  DULoxetine (CYMBALTA) 30 MG capsule Take 30 mg by mouth daily.    [provider]  guaiFENesin (ROBITUSSIN) 100 MG/5ML liquid Take 5 mLs by mouth every 4 (four) hours as needed for cough or to loosen phlegm.    [provider]  hydrocortisone cream 1 % Apply 1 Application topically as needed for itching.    [provider]  ibuprofen (ADVIL) 200 MG tablet Take 200-600 mg by mouth as needed.    [provider]  ketoconazole (NIZORAL) 2 % shampoo Apply 1 application. topically every Monday, Wednesday, and Friday.    [provider]  Lacosamide 100 MG TABS Take 1.5 tablets (150 mg total) by mouth in the morning and at bedtime. 03/26/23 03/20/24  Benjiman Core, MD  Lactobacillus (ACIDOPHILUS/BIFIDUS PO) Take 2 capsules by mouth daily.    [provider]  levETIRAcetam (KEPPRA) 750 MG tablet Take 2 tablets (1,500 mg total) by mouth 2 (two) times daily. 08/01/22 07/27/23  Windell Norfolk, MD  lisinopril (ZESTRIL) 10 MG tablet Take 10 mg by mouth every evening.    [provider]  loperamide (IMODIUM A-D) 2 MG tablet Take 4 mg by mouth as needed for diarrhea or loose stools.    [provider]  LORazepam (ATIVAN) 1 MG tablet Take 1 mg by mouth as directed. Prior to dental procedures    [provider]   magnesium hydroxide (MILK OF MAGNESIA) 400 MG/5ML suspension Take 30 mLs by mouth as needed for mild constipation.    [provider]  mupirocin ointment (BACTROBAN) 2 % Apply 1 Application topically as needed.    [provider]  neomycin-bacitracin-polymyxin (NEOSPORIN) OINT Apply 1 Application topically as needed for wound care.    [provider]  polyethylene glycol (MIRALAX / GLYCOLAX) 17 g packet Take 17 g by mouth every other day.    [provider]  promethazine (PHENERGAN) 12.5 MG tablet Take 12.5 mg by mouth every 12 (twelve) hours as needed for nausea or vomiting.    [provider]  promethazine (PHENERGAN) 25 MG suppository Place 25 mg rectally every 6 (six) hours as needed for nausea or vomiting.    [provider]  Vitamins A & D (VITAMIN A & D) ointment Apply 1 Application topically as needed for dry skin.    [provider]  zinc oxide (BALMEX) 11.3 % CREA cream Apply 1 Application topically as needed.    [provider]      Allergies    Patient has no known allergies.    Review of Systems   Review of Systems  Unable to perform ROS: Other    Physical Exam Updated Vital Signs BP (!) 136/91   Pulse 96   Temp 97.7 F (36.5 C) (Axillary)   Resp (!) 21   SpO2 100%  Physical Exam Cardiovascular:     Rate and Rhythm: Normal rate and regular rhythm.     Heart sounds: Normal heart sounds.  Pulmonary:     Effort: Pulmonary effort is normal.     Breath sounds: Normal breath sounds.  Abdominal:     General: There is no distension.     Palpations: Abdomen is soft.     Tenderness: There is no abdominal tenderness.  Skin:    General: Skin is warm and dry.     ED Results / Procedures / Treatments   Labs (all labs ordered are listed, but only abnormal results are displayed) Labs Reviewed  CBC WITH DIFFERENTIAL/PLATELET - Abnormal; Notable for the following components:      Result Value   WBC  12.0 (*)    Neutro Abs 7.8 (*)    Monocytes Absolute 1.6 (*)    All other components within normal limits  COMPREHENSIVE METABOLIC PANEL - Abnormal; Notable for the following components:   CO2 21 (*)    Anion gap 18 (*)    All other components within normal limits  URINALYSIS, ROUTINE W REFLEX MICROSCOPIC - Abnormal; Notable for the following components:   Leukocytes,Ua TRACE (*)    Bacteria, UA RARE (*)    All other components within normal limits    EKG EKG Interpretation Date/Time:  Tuesday June 20 2023 16:21:00 EDT Ventricular Rate:  69 PR  Interval:  166 QRS Duration:  102 QT Interval:  370 QTC Calculation: 397 R Axis:   -1  Text Interpretation: Sinus rhythm Abnormal T, consider ischemia, lateral leads when compard to prior, similar appearance. No STEMI Confirmed by Theda Belfast ([id number]) on 06/20/2023 4:23:07 PM  Radiology CT Head Wo Contrast  Result Date: 06/20/2023 CLINICAL DATA:  Larey Seat out of wheelchair, unwitnessed fall EXAM: CT HEAD WITHOUT CONTRAST CT CERVICAL SPINE WITHOUT CONTRAST TECHNIQUE: Multidetector CT imaging of the head and cervical spine was performed following the standard protocol without intravenous contrast. Multiplanar CT image reconstructions of the cervical spine were also generated. RADIATION DOSE REDUCTION: This exam was performed according to the departmental dose-optimization program which includes automated exposure control, adjustment of the mA and/or kV according to patient size and/or use of iterative reconstruction technique. COMPARISON:  03/26/2023 CT head, no prior CT cervical spine. FINDINGS: CT HEAD FINDINGS Brain: No evidence of acute infarct, hemorrhage, mass, mass effect, or midline shift. No hydrocephalus or extra-axial fluid collection. 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 Unchanged left frontal lobe encephalomalacia. Vascular: No hyperdense vessel. Skull: Negative for  fracture or focal lesion. Sinuses/Orbits: No acute finding. Other: The mastoid air cells are well aerated. CT CERVICAL SPINE FINDINGS Alignment: No traumatic listhesis. Skull base and vertebrae: No acute fracture or suspicious osseous lesion. Endplate degenerative changes at C5-C6 and C6-C7. Soft tissues and spinal canal: No prevertebral fluid or swelling. No visible canal hematoma. Disc levels: Degenerative changes in the cervical spine. No high-grade spinal canal stenosis. Upper chest: Negative. IMPRESSION: 1. No acute intracranial process. 2. No acute fracture or traumatic listhesis in the cervical spine. Electronically Signed   By: Wiliam Ke M.D.   On: 06/20/2023 18:06   CT Cervical Spine Wo Contrast  Result Date: 06/20/2023 CLINICAL DATA:  Larey Seat out of wheelchair, unwitnessed fall EXAM: CT HEAD WITHOUT CONTRAST CT CERVICAL SPINE WITHOUT CONTRAST TECHNIQUE: Multidetector CT imaging of the head and cervical spine was performed following the standard protocol without intravenous contrast. Multiplanar CT image reconstructions of the cervical spine were also generated. RADIATION DOSE REDUCTION: This exam was performed according to the departmental dose-optimization program which includes automated exposure control, adjustment of the mA and/or kV according to patient size and/or use of iterative reconstruction technique. COMPARISON:  03/26/2023 CT head, no prior CT cervical spine. FINDINGS: CT HEAD FINDINGS Brain: No evidence of acute infarct, hemorrhage, mass, mass effect, or midline shift. No hydrocephalus or extra-axial fluid collection. 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 Unchanged left frontal lobe encephalomalacia. Vascular: No hyperdense vessel. Skull: Negative for fracture or focal lesion. Sinuses/Orbits: No acute finding. Other: The mastoid air cells are well aerated. CT CERVICAL SPINE FINDINGS Alignment: No traumatic  listhesis. Skull base and vertebrae: No acute fracture or suspicious osseous lesion. Endplate degenerative changes at C5-C6 and C6-C7. Soft tissues and spinal canal: No prevertebral fluid or swelling. No visible canal hematoma. Disc levels: Degenerative changes in the cervical spine. No high-grade spinal canal stenosis. Upper chest: Negative. IMPRESSION: 1. No acute intracranial process. 2. No acute fracture or traumatic listhesis in the cervical spine. Electronically Signed   By: Wiliam Ke M.D.   On: 06/20/2023 18:06   DG Chest Portable 1 View  Result Date: 06/20/2023 CLINICAL DATA:  Pain after fall EXAM: PORTABLE CHEST 1 VIEW COMPARISON:  X-ray 04/19/2022 FINDINGS: Multiple old right-sided rib fractures and deformities. Enlarged heart. Tortuous ectatic  aorta. Film is slightly rotated. There is developing consolidative opacity at the left lung base. No pneumothorax or edema. Curvature and degenerative changes of the spine. Overlapping cardiac leads. IMPRESSION: Left lung base consolidative opacity. Possible acute infiltrate. Recommend follow-up Electronically Signed   By: Karen Kays M.D.   On: 06/20/2023 17:45    Procedures Procedures    Medications Ordered in ED Medications - No data to display  ED Course/ Medical Decision Making/ A&P                             Medical Decision Making Patient presented to the ED after an unwitnessed fall. CT head and neck showed no acute fracture. WBC was elevated to 19, however UA was unremarkable, CXR showed possible pulmonary infiltrates. After a shared decision making discussion with caregiver, patient was discharged home.   Amount and/or Complexity of Data Reviewed Independent Historian: caregiver External Data Reviewed: labs and radiology. Labs: ordered. Radiology: ordered.           Final Clinical Impression(s) / ED Diagnoses Final diagnoses:  Fall, initial encounter    Rx / DC Orders ED Discharge Orders     None          Monna Fam, MD 06/20/23 2032    Tegeler, Canary Brim, MD 06/21/23 [phone]

## 2023-08-02 ENCOUNTER — Ambulatory Visit (INDEPENDENT_AMBULATORY_CARE_PROVIDER_SITE_OTHER): Payer: Medicare Other | Admitting: Neurology

## 2023-08-02 ENCOUNTER — Encounter: Payer: Self-pay | Admitting: Neurology

## 2023-08-02 VITALS — BP 110/72 | Wt 133.0 lb

## 2023-08-02 DIAGNOSIS — G809 Cerebral palsy, unspecified: Secondary | ICD-10-CM | POA: Diagnosis not present

## 2023-08-02 DIAGNOSIS — G40919 Epilepsy, unspecified, intractable, without status epilepticus: Secondary | ICD-10-CM | POA: Diagnosis not present

## 2023-08-02 NOTE — Patient Instructions (Signed)
Continue with Vimpat 200 mg twice daily Continue with Keppra 1500 mg twice daily Most recent antiseizure medication level within normal limits, Keppra level was elevated but reflected a high dose of Keppra If patient continued to have seizure cluster, we will have to add a third medication; consideration include Clobazam vs. Zonisamide vs. Cenobamate Continue follow-up PCP Follow-up in 1 year or sooner if worse.

## 2023-08-02 NOTE — Progress Notes (Signed)
[facility]  Howard Best: Howard Best DOB: 05/06/1951  REQUESTING CLINICIAN: Royals, Gretta Began, MD HISTORY FROM: Chart review and Caregiver  REASON FOR VISIT: Seizure disorder    HISTORICAL  CHIEF COMPLAINT:  Chief Complaint  Howard Best presents with   Follow-up    Rm 13, with caregiver Sherae, had cluster seizure this morning. No missed medication doses that she is aware of   INTERVAL HISTORY 08/02/2023 Howard Best presents today for follow-up, Howard Best is accompanied by caregiver.  Last visit was a year ago since then Howard Best has been doing well.  Caregiver report about 1-2 seizures per month.  These are his typical seizures where Howard Best will have a grunting noise and look like difficulty breathing.  They will find him on or put cold water on him which usually help with the seizures.  Howard Best is compliant with his medication, Keppra 1500 mg twice daily and Vimpat 200 mg twice daily.  His Vimpat was recently increase when Howard Best had a cluster of seizure in April requiring intranasal rescue medication and trip to the hospital. His last seizure was this morning.  They denies any generalized convulsion, no injuries and no recent falls.   HISTORY OF PRESENT ILLNESS:  Howard Best is a 72 year old gentleman with past medical history of seizure disorder, cerebral palsy, wheelchair dependent, intellectual disability who was brought in to the clinic by caregiver for his diagnosis of seizures. Howard Best was admitted back in April for seizure cluster.  Per chart review there is report of Howard Best having 1-2 seizures per month versus 1-2 seizures per year.  Howard Best was on gabapentin and Topamax but on the day of admission, Howard Best presented with a cluster of 6 seizures.  In the hospital his medication were switched to Keppra and Vimpat, his EEG did not show any acute seizures and since then Howard Best has been doing well.  Per caregiver, no additional seizures since being discharged, Howard Best is tolerating the medication very well.   Caregiver has been working with the Howard Best for the past 4 months and has not witnessed any seizures. No other complaints    Hospital Summary  Howard Best is a 72 y.o. M with cerebral palsy, wheelchair dependent, congenital cognitive impairment and hx seizures who presented with seizures. Evidently, Howard Best has occasional breakthrough seizures (1-2 per month by one report, 1-2 per year by another).  On the day of admission, Howard Best had a cluster of 6 seizures and then was sleepy and unresponsive so EMS were called. In the hospital, neuraxial imaging unremarkable.  EEG normal.  No evidence of infection or electrolyte abnormality other than mild non-gap metabolic acidosis and mild hypokalemia (in the setting of stopping Topamax) which corrected. His gabapentin and Topamax were stopped and Howard Best was transitioned to Keppra and Vimpat.  Howard Best should have close Neurology follow up.   OTHER MEDICAL CONDITIONS: Cerebral Palsy, Seizure disorder, intellectual disability  REVIEW OF SYSTEMS: Full 14 system review of systems performed and negative with exception of:  Unable to fully obtain due to intellectual disability   ALLERGIES: No Known Allergies  HOME MEDICATIONS: Outpatient Medications Prior to Visit  Medication Sig Dispense Refill   acetaminophen (TYLENOL) 160 MG/5ML solution Take 20 mLs by mouth every 6 (six) hours as needed.     acetaminophen (TYLENOL) 325 MG tablet Take 650 mg by mouth every 4 (four) hours as needed.     alum & mag hydroxide-simeth (MAALOX/MYLANTA) 200-200-20 MG/5ML suspension Take 15 mLs by mouth as needed for indigestion or heartburn.  bisacodyl (FLEET) 10 MG/30ML ENEM Place 10 mg rectally as needed.     Carbamide Peroxide-Saline (CLEARCANAL EAR WAX REMOVAL) 6.5 % KIT Place in ear(s) as needed.     chlorhexidine (PERIDEX) 0.12 % solution Use as directed 5 mLs in the mouth or throat at bedtime.     Cholecalciferol (VITAMIN D) 50 MCG (2000 UT) CAPS Take 2,000 Units by mouth  daily.     diazePAM, 15 MG Dose, (VALTOCO 15 MG DOSE) 2 x 7.5 MG/0.1ML LQPK Place 7.5 mg into the nose as directed. As needed for seizure greater than or equal to 3 minutes. After 4 hours if seizure recurs, repeat dose. Max 1 episode eery 5 days. Alternate nostrils with each spray.     diphenhydrAMINE (BENADRYL) 12.5 MG/5ML liquid Take 10 mLs by mouth every 6 (six) hours as needed.     docusate sodium (COLACE) 100 MG capsule Take 100 mg by mouth daily.     DULoxetine (CYMBALTA) 30 MG capsule Take 30 mg by mouth daily.     guaiFENesin (ROBITUSSIN) 100 MG/5ML liquid Take 5 mLs by mouth every 4 (four) hours as needed for cough or to loosen phlegm.     hydrocortisone cream 1 % Apply 1 Application topically as needed for itching.     ketoconazole (NIZORAL) 2 % shampoo Apply 1 application. topically every Monday, Wednesday, and Friday.     Lactobacillus (ACIDOPHILUS/BIFIDUS PO) Take 2 capsules by mouth daily.     lisinopril (ZESTRIL) 10 MG tablet Take 10 mg by mouth every evening.     loperamide (IMODIUM A-D) 2 MG tablet Take 4 mg by mouth as needed for diarrhea or loose stools.     LORazepam (ATIVAN) 1 MG tablet Take 2 mg by mouth as directed. Prior to dental procedures     magnesium hydroxide (MILK OF MAGNESIA) 400 MG/5ML suspension Take 30 mLs by mouth as needed for mild constipation.     polyethylene glycol (MIRALAX / GLYCOLAX) 17 g packet Take 17 g by mouth every other day.     promethazine (PHENERGAN) 12.5 MG tablet Take 12.5 mg by mouth every 12 (twelve) hours as needed for nausea or vomiting.     promethazine (PHENERGAN) 25 MG suppository Place 25 mg rectally every 6 (six) hours as needed for nausea or vomiting.     Brompheniramine-Pseudoeph (DIMETAPP PO) Take 20 mLs by mouth as needed. (Howard Best not taking: Reported on 08/02/2023)     diazePAM (VALTOCO 5 MG DOSE) 5 MG/0.1ML LIQD Place 1 spray into the nose daily as needed (seizure). (Howard Best not taking: Reported on 08/02/2023)     Diethyltoluamide  (OFF ACTIVE) 15 % AERO Apply topically as needed. (Howard Best not taking: Reported on 08/02/2023)     diphenhydrAMINE (BENADRYL ALLERGY) 25 MG tablet Take 25 mg by mouth every 6 (six) hours as needed. (Howard Best not taking: Reported on 08/02/2023)     ibuprofen (ADVIL) 200 MG tablet Take 200-600 mg by mouth as needed. (Howard Best not taking: Reported on 08/02/2023)     Lacosamide 100 MG TABS Take 1.5 tablets (150 mg total) by mouth in the morning and at bedtime. (Howard Best taking differently: Take 200 mg by mouth in the morning and at bedtime.) 100 tablet 3   levETIRAcetam (KEPPRA) 750 MG tablet Take 2 tablets (1,500 mg total) by mouth 2 (two) times daily. 360 tablet 3   mupirocin ointment (BACTROBAN) 2 % Apply 1 Application topically as needed. (Howard Best not taking: Reported on 08/02/2023)     neomycin-bacitracin-polymyxin (NEOSPORIN) OINT  Apply 1 Application topically as needed for wound care. (Howard Best not taking: Reported on 08/02/2023)     Vitamins A & D (VITAMIN A & D) ointment Apply 1 Application topically as needed for dry skin. (Howard Best not taking: Reported on 08/02/2023)     zinc oxide (BALMEX) 11.3 % CREA cream Apply 1 Application topically as needed. (Howard Best not taking: Reported on 08/02/2023)     No facility-administered medications prior to visit.    PAST MEDICAL HISTORY: Past Medical History:  Diagnosis Date   Cerebral palsy (HCC)    Epilepsy (HCC)    Intellectual disability    Major depressive disorder, single episode, unspecified     PAST SURGICAL HISTORY: History reviewed. No pertinent surgical history.  FAMILY HISTORY: History reviewed. No pertinent family history.  SOCIAL HISTORY: Social History   Socioeconomic History   Marital status: Single    Spouse name: Not on file   Number of children: Not on file   Years of education: Not on file   Highest education level: Not on file  Occupational History   Not on file  Tobacco Use   Smoking status: Never   Smokeless tobacco: Never   Substance and Sexual Activity   Alcohol use: Not Currently   Drug use: Not Currently   Sexual activity: Not Currently  Other Topics Concern   Not on file  Social History Narrative   Not on file   Social Determinants of Health   Financial Resource Strain: Not on file  Food Insecurity: Not on file  Transportation Needs: Not on file  Physical Activity: Not on file  Stress: Not on file  Social Connections: Unknown (03/22/2023)   Received from [facility], [facility]   Social Network    Social Network: Not on file  Intimate Partner Violence: Unknown (03/22/2023)   Received from [employer], [facility]   HITS    Physically Hurt: Not on file    Insult or Talk Down To: Not on file    Threaten Physical Harm: Not on file    Scream or Curse: Not on file     PHYSICAL EXAM  GENERAL EXAM/CONSTITUTIONAL: Vitals:  Vitals:   08/02/23 1023  BP: 110/72  Weight: 133 lb (60.3 kg)   Body mass index is 21.47 kg/m. Wt Readings from Last 3 Encounters:  08/02/23 133 lb (60.3 kg)  04/14/23 150 lb (68 kg)  04/16/22 150 lb 9.2 oz (68.3 kg)   Howard Best is in no distress; well developed, nourished and groomed; neck is supple Able to follow simple commands  Upper extremities at least antigravity  Able to track examiner Wheelchair dependent     DIAGNOSTIC DATA (LABS, IMAGING, TESTING) - I reviewed Howard Best records, labs, notes, testing and imaging myself where available.  Lab Results  Component Value Date   WBC 12.0 (H) 06/20/2023   HGB 15.8 06/20/2023   HCT 48.7 06/20/2023   MCV 97.6 06/20/2023   PLT 255 06/20/2023      Component Value Date/Time   NA 139 06/20/2023 1639   K 4.4 06/20/2023 1639   CL 100 06/20/2023 1639   CO2 21 (L) 06/20/2023 1639   GLUCOSE 77 06/20/2023 1639   BUN 13 06/20/2023 1639   CREATININE 0.81 06/20/2023 1639   CALCIUM 9.3 06/20/2023 1639   PROT 6.9 06/20/2023 1639   ALBUMIN 3.9 06/20/2023 1639   AST 21 06/20/2023 1639   ALT 13 06/20/2023  1639   ALKPHOS 63 06/20/2023 1639   BILITOT 0.6 06/20/2023 1639  GFRNONAA >60 06/20/2023 1639   GFRAA >60 09/10/2020 1715   No results found for: "CHOL", "HDL", "LDLCALC", "LDLDIRECT", "TRIG" No results found for: "HGBA1C" No results found for: "VITAMINB12"+ No results found for: "TSH"  LTM EEG 04/17/2022 ABNORMALITY - Continuous slow, generalized   IMPRESSION: This study is suggestive of moderate diffuse encephalopathy, nonspecific etiology. No seizures or epileptiform discharges were seen throughout the recording.   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 No acute abnormality is noted.  I personally reviewed brain Images and previous EEG reports.   ASSESSMENT AND PLAN  72 y.o. year old male  with with history of intellectual disability, cerebral palsy, seizure disorder who is presenting for follow up.  Howard Best continued to have intractable seizures, last ED visit was in April where his Vimpat was increased to 200 mg twice daily with his Keppra 1500 mg twice daily.  They report last seizure was this morning, his typical seizure.  Plan will be to continue Howard Best on current medications Vimpat and Keppra, but if Howard Best has another seizure cluster despite medication compliance, we will have to add a third medication.  Consideration will include zonisamide versus clobazam versus cenobamate.  Continue to follow with PCP and return in 1 year or sooner if if worse.  1. Intractable epilepsy without status epilepticus, unspecified epilepsy type (HCC)   2. Cerebral palsy, unspecified type [facility])      Howard Best Instructions  Continue with Vimpat 200 mg twice daily Continue with Keppra 1500 mg twice daily Most recent antiseizure medication level within normal limits, Keppra level was elevated but reflected a high dose of Keppra If Howard Best continued to have seizure cluster, we will have to add a third medication; consideration  include Clobazam vs. Zonisamide vs. Cenobamate Continue follow-up PCP Follow-up in 1 year or sooner if worse.   Per [facility] statutes, patients with seizures are not allowed to drive until they have been seizure-free for six months.  Other recommendations include using caution when using heavy equipment or power tools. Avoid working on ladders or at heights. Take showers instead of baths.  Do not swim alone.  Ensure the water temperature is not too high on the home water heater. Do not go swimming alone. Do not lock yourself in a room alone (i.e. bathroom). When caring for infants or small children, sit down when holding, feeding, or changing them to minimize risk of injury to the child in the event you have a seizure. Maintain good sleep hygiene. Avoid alcohol.  Also recommend adequate sleep, hydration, good diet and minimize stress.   During the Seizure  - First, ensure adequate ventilation and place patients on the floor on their left side  Loosen clothing around the neck and ensure the airway is patent. If the Howard Best is clenching the teeth, do not force the mouth open with any object as this can cause severe damage - Remove all items from the surrounding that can be hazardous. The Howard Best may be oblivious to what's happening and may not even know what Howard Best or she is doing. If the Howard Best is confused and wandering, either gently guide him/her away and block access to outside areas - Reassure the individual and be comforting - Call 911. In most cases, the seizure ends before EMS arrives. However, there are cases when seizures may last over 3 to 5 minutes. Or the individual may have developed breathing difficulties or severe injuries. If a pregnant Howard Best or  a person with diabetes develops a seizure, it is prudent to call an ambulance. - Finally, if the Howard Best does not regain full consciousness, then call EMS. Most patients will remain confused for about 45 to 90 minutes after a seizure,  so you must use judgment in calling for help. - Avoid restraints but make sure the Howard Best is in a bed with padded side rails - Place the individual in a lateral position with the neck slightly flexed; this will help the saliva drain from the mouth and prevent the tongue from falling backward - Remove all nearby furniture and other hazards from the area - Provide verbal assurance as the individual is regaining consciousness - Provide the Howard Best with privacy if possible - Call for help and start treatment as ordered by the caregiver   After the Seizure (Postictal Stage)  After a seizure, most patients experience confusion, fatigue, muscle pain and/or a headache. Thus, one should permit the individual to sleep. For the next few days, reassurance is essential. Being calm and helping reorient the person is also of importance.  Most seizures are painless and end spontaneously. Seizures are not harmful to others but can lead to complications such as stress on the lungs, brain and the heart. Individuals with prior lung problems may develop labored breathing and respiratory distress.     No orders of the defined types were placed in this encounter.   No orders of the defined types were placed in this encounter.   Return in about 1 year (around 08/01/2024).    Windell Norfolk, MD 08/02/2023, 11:34 AM  [facility] [address], [address] [place], [place] [address] [phone]

## 2023-08-04 ENCOUNTER — Other Ambulatory Visit: Payer: Self-pay | Admitting: Family Medicine

## 2023-08-04 ENCOUNTER — Ambulatory Visit
Admission: RE | Admit: 2023-08-04 | Discharge: 2023-08-04 | Disposition: A | Discharge status: Home / Self Care | Payer: Medicare Other | Source: Ambulatory Visit | Attending: Family Medicine | Admitting: Family Medicine

## 2023-08-04 DIAGNOSIS — M2460 Ankylosis, unspecified joint: Secondary | ICD-10-CM

## 2023-08-04 DIAGNOSIS — R609 Edema, unspecified: Secondary | ICD-10-CM

## 2023-11-07 ENCOUNTER — Other Ambulatory Visit: Payer: Self-pay

## 2023-11-07 ENCOUNTER — Emergency Department (HOSPITAL_COMMUNITY): Payer: Medicare Other

## 2023-11-07 ENCOUNTER — Encounter (HOSPITAL_COMMUNITY): Payer: Self-pay

## 2023-11-07 ENCOUNTER — Emergency Department (HOSPITAL_COMMUNITY)
Admission: EM | Admit: 2023-11-07 | Discharge: 2023-11-07 | Disposition: A | Discharge status: Home Health | Payer: Medicare Other | Attending: Emergency Medicine | Admitting: Emergency Medicine

## 2023-11-07 DIAGNOSIS — I10 Essential (primary) hypertension: Secondary | ICD-10-CM | POA: Insufficient documentation

## 2023-11-07 DIAGNOSIS — R569 Unspecified convulsions: Secondary | ICD-10-CM | POA: Insufficient documentation

## 2023-11-07 DIAGNOSIS — Z79899 Other long term (current) drug therapy: Secondary | ICD-10-CM | POA: Diagnosis not present

## 2023-11-07 DIAGNOSIS — W01198A Fall on same level from slipping, tripping and stumbling with subsequent striking against other object, initial encounter: Secondary | ICD-10-CM | POA: Diagnosis not present

## 2023-11-07 DIAGNOSIS — S0990XA Unspecified injury of head, initial encounter: Secondary | ICD-10-CM | POA: Diagnosis present

## 2023-11-07 DIAGNOSIS — S0081XA Abrasion of other part of head, initial encounter: Secondary | ICD-10-CM | POA: Diagnosis not present

## 2023-11-07 DIAGNOSIS — R531 Weakness: Secondary | ICD-10-CM | POA: Diagnosis not present

## 2023-11-07 LAB — CBC WITH DIFFERENTIAL/PLATELET
Abs Immature Granulocytes: 0.05 10*3/uL (ref 0.00–0.07)
Basophils Absolute: 0.1 10*3/uL (ref 0.0–0.1)
Basophils Relative: 1 %
Eosinophils Absolute: 0.2 10*3/uL (ref 0.0–0.5)
Eosinophils Relative: 3 %
HCT: 45.4 % (ref 39.0–52.0)
Hemoglobin: 15.7 g/dL (ref 13.0–17.0)
Immature Granulocytes: 1 %
Lymphocytes Relative: 21 %
Lymphs Abs: 1.7 10*3/uL (ref 0.7–4.0)
MCH: 33.3 pg (ref 26.0–34.0)
MCHC: 34.6 g/dL (ref 30.0–36.0)
MCV: 96.4 fL (ref 80.0–100.0)
Monocytes Absolute: 0.9 10*3/uL (ref 0.1–1.0)
Monocytes Relative: 11 %
Neutro Abs: 5.2 10*3/uL (ref 1.7–7.7)
Neutrophils Relative %: 63 %
Platelets: 250 10*3/uL (ref 150–400)
RBC: 4.71 MIL/uL (ref 4.22–5.81)
RDW: 12.5 % (ref 11.5–15.5)
WBC: 8.1 10*3/uL (ref 4.0–10.5)
nRBC: 0 % (ref 0.0–0.2)

## 2023-11-07 LAB — URINALYSIS, ROUTINE W REFLEX MICROSCOPIC
Bilirubin Urine: NEGATIVE
Glucose, UA: NEGATIVE mg/dL
Hgb urine dipstick: NEGATIVE
Ketones, ur: NEGATIVE mg/dL
Nitrite: NEGATIVE
Protein, ur: NEGATIVE mg/dL
Specific Gravity, Urine: 1.016 (ref 1.005–1.030)
pH: 5 (ref 5.0–8.0)

## 2023-11-07 LAB — COMPREHENSIVE METABOLIC PANEL
ALT: 12 U/L (ref 0–44)
AST: 22 U/L (ref 15–41)
Albumin: 4.4 g/dL (ref 3.5–5.0)
Alkaline Phosphatase: 75 U/L (ref 38–126)
Anion gap: 10 (ref 5–15)
BUN: 17 mg/dL (ref 8–23)
CO2: 26 mmol/L (ref 22–32)
Calcium: 9.6 mg/dL (ref 8.9–10.3)
Chloride: 103 mmol/L (ref 98–111)
Creatinine, Ser: 0.8 mg/dL (ref 0.61–1.24)
GFR, Estimated: >60 mL/min (ref >60)
Glucose, Bld: 101 mg/dL — ABNORMAL HIGH (ref 70–99)
Potassium: 4.2 mmol/L (ref 3.5–5.1)
Sodium: 139 mmol/L (ref 135–145)
Total Bilirubin: 0.7 mg/dL (ref <1.2)
Total Protein: 7.4 g/dL (ref 6.5–8.1)

## 2023-11-07 MED ORDER — LORAZEPAM 2 MG/ML IJ SOLN
1.0000 mg | Freq: Once | INTRAMUSCULAR | Status: AC
  Administered 2023-11-07: 1 mg via INTRAMUSCULAR
  Filled 2023-11-07: qty 1

## 2023-11-07 MED ORDER — LORAZEPAM 2 MG/ML IJ SOLN: 1.0000 mg | Freq: Once | INTRAMUSCULAR | Status: DC

## 2023-11-07 NOTE — Discharge Instructions (Signed)
Continue your seizure medications.  Follow-up with your neurologist to be rechecked.  Return to the ED for recurrent episodes

## 2023-11-07 NOTE — ED Triage Notes (Signed)
Per EMS  Seizure  Grand ma Unknown down time Fall Hit head Unwitnessed  Aox3 Agitated

## 2023-11-07 NOTE — ED Notes (Signed)
Per Dr. Lynelle Doctor temporarily apply restraints and given IM Ativan to help facilitate nursing care

## 2023-11-07 NOTE — ED Provider Notes (Signed)
[place] [facility]PARTMENT AT [facility] Provider Note   CSN: [id number] Arrival date & time: 11/07/23  1645     History {Add pertinent medical, surgical, social history, OB history to HPI:1} Chief Complaint  Patient presents with   Seizures   Fall    Howard Best is a 72 y.o. male.   Seizures Fall     Patient has a history of cerebral palsy, intellectual disability, depression, seizures, hypertension, wheelchair dependence, hypokalemia who presents ED for evaluation of a seizure.  Patient has a history of seizures approximately 1-2 times per month per his last neurology note this year.  Patient reportedly was sent to the ED after a grand mal seizure.  This was witnessed.  Unclear duration of time.  Patient did hit his head.  Initially patient was agitated and EMS was called.  Patient is now awake and alert he denies any complaints of headache.  He is not having any pain.  He denies any vomiting or diarrhea no fevers.  Patient does not recall having a seizure.  He feels the staff at his facility feed him too much  Home Medications Prior to Admission medications   Medication Sig Start Date End Date Taking? Authorizing Provider  acetaminophen (TYLENOL) 160 MG/5ML solution Take 20 mLs by mouth every 6 (six) hours as needed.    [provider]  acetaminophen (TYLENOL) 325 MG tablet Take 650 mg by mouth every 4 (four) hours as needed.    [provider]  alum & mag hydroxide-simeth (MAALOX/MYLANTA) 200-200-20 MG/5ML suspension Take 15 mLs by mouth as needed for indigestion or heartburn.    [provider]  bisacodyl (FLEET) 10 MG/30ML ENEM Place 10 mg rectally as needed.    [provider]  Brompheniramine-Pseudoeph (DIMETAPP PO) Take 20 mLs by mouth as needed. Patient not taking: Reported on 08/02/2023    [provider]  Carbamide Peroxide-Saline (CLEARCANAL EAR WAX REMOVAL) 6.5 % KIT Place in ear(s) as needed.     [provider]  chlorhexidine (PERIDEX) 0.12 % solution Use as directed 5 mLs in the mouth or throat at bedtime.    [provider]  Cholecalciferol (VITAMIN D) 50 MCG (2000 UT) CAPS Take 2,000 Units by mouth daily.    [provider]  diazePAM (VALTOCO 5 MG DOSE) 5 MG/0.1ML LIQD Place 1 spray into the nose daily as needed (seizure). Patient not taking: Reported on 08/02/2023    [provider]  diazePAM, 15 MG Dose, (VALTOCO 15 MG DOSE) 2 x 7.5 MG/0.1ML LQPK Place 7.5 mg into the nose as directed. As needed for seizure greater than or equal to 3 minutes. After 4 hours if seizure recurs, repeat dose. Max 1 episode eery 5 days. Alternate nostrils with each spray.    [provider]  Diethyltoluamide (OFF ACTIVE) 15 % AERO Apply topically as needed. Patient not taking: Reported on 08/02/2023    [provider]  diphenhydrAMINE (BENADRYL ALLERGY) 25 MG tablet Take 25 mg by mouth every 6 (six) hours as needed. Patient not taking: Reported on 08/02/2023    [provider]  diphenhydrAMINE (BENADRYL) 12.5 MG/5ML liquid Take 10 mLs by mouth every 6 (six) hours as needed.    [provider]  docusate sodium (COLACE) 100 MG capsule Take 100 mg by mouth daily.    [provider]  DULoxetine (CYMBALTA) 30 MG capsule Take 30 mg by mouth daily.    [provider]  guaiFENesin (ROBITUSSIN) 100 MG/5ML  liquid Take 5 mLs by mouth every 4 (four) hours as needed for cough or to loosen phlegm.    [provider]  hydrocortisone cream 1 % Apply 1 Application topically as needed for itching.    [provider]  ibuprofen (ADVIL) 200 MG tablet Take 200-600 mg by mouth as needed. Patient not taking: Reported on 08/02/2023    [provider]  ketoconazole (NIZORAL) 2 % shampoo Apply 1 application. topically every Monday, Wednesday, and Friday.    [provider]  Lacosamide 100 MG TABS Take 1.5 tablets  (150 mg total) by mouth in the morning and at bedtime. Patient taking differently: Take 200 mg by mouth in the morning and at bedtime. 03/26/23 03/20/24  Benjiman Core, MD  Lactobacillus (ACIDOPHILUS/BIFIDUS PO) Take 2 capsules by mouth daily.    [provider]  levETIRAcetam (KEPPRA) 750 MG tablet Take 2 tablets (1,500 mg total) by mouth 2 (two) times daily. 08/01/22 07/27/23  Windell Norfolk, MD  lisinopril (ZESTRIL) 10 MG tablet Take 10 mg by mouth every evening.    [provider]  loperamide (IMODIUM A-D) 2 MG tablet Take 4 mg by mouth as needed for diarrhea or loose stools.    [provider]  LORazepam (ATIVAN) 1 MG tablet Take 2 mg by mouth as directed. Prior to dental procedures    [provider]  magnesium hydroxide (MILK OF MAGNESIA) 400 MG/5ML suspension Take 30 mLs by mouth as needed for mild constipation.    [provider]  mupirocin ointment (BACTROBAN) 2 % Apply 1 Application topically as needed. Patient not taking: Reported on 08/02/2023    [provider]  neomycin-bacitracin-polymyxin (NEOSPORIN) OINT Apply 1 Application topically as needed for wound care. Patient not taking: Reported on 08/02/2023    [provider]  polyethylene glycol (MIRALAX / GLYCOLAX) 17 g packet Take 17 g by mouth every other day.    [provider]  promethazine (PHENERGAN) 12.5 MG tablet Take 12.5 mg by mouth every 12 (twelve) hours as needed for nausea or vomiting.    [provider]  promethazine (PHENERGAN) 25 MG suppository Place 25 mg rectally every 6 (six) hours as needed for nausea or vomiting.    [provider]  Vitamins A & D (VITAMIN A & D) ointment Apply 1 Application topically as needed for dry skin. Patient not taking: Reported on 08/02/2023    [provider]  zinc oxide (BALMEX) 11.3 % CREA cream Apply 1 Application topically as needed. Patient not taking: Reported on 08/02/2023    [provider]      Allergies    Patient has no known allergies.    Review of Systems   Review of Systems  Neurological:  Positive for seizures.    Physical Exam Updated Vital Signs BP (!) 109/98   Pulse 80   Temp 97.9 F (36.6 C)   Resp 17   SpO2 94%  Physical Exam Vitals and nursing note reviewed.  Constitutional:      Appearance: He is well-developed. He is not diaphoretic.  HENT:     Head: Normocephalic.     Comments: Small abrasion noted forehead    Right Ear: External ear normal.     Left Ear: External ear normal.  Eyes:     General: No scleral icterus.       Right eye: No discharge.        Left eye: No discharge.     Conjunctiva/sclera: Conjunctivae normal.  Neck:     Trachea: No tracheal deviation.  Cardiovascular:     Rate and Rhythm: Normal rate and regular rhythm.  Pulmonary:     Effort: Pulmonary effort is normal. No respiratory distress.     Breath sounds: Normal breath sounds. No stridor. No wheezing or rales.  Abdominal:     General: Bowel sounds are normal. There is no distension.     Palpations: Abdomen is soft.     Tenderness: There is no abdominal tenderness. There is no guarding or rebound.  Musculoskeletal:        General: No tenderness or deformity.     Cervical back: Neck supple.  Skin:    General: Skin is warm and dry.     Findings: No rash.  Neurological:     Mental Status: He is alert. Mental status is at baseline.     Cranial Nerves: No cranial nerve deficit, dysarthria or facial asymmetry.     Sensory: No sensory deficit.     Motor: Weakness and atrophy present. No seizure activity.     Coordination: Coordination normal.     Comments: Contractures bilateral upper extremity lower extremities  Psychiatric:        Mood and Affect: Mood normal.     ED Results / Procedures / Treatments   Labs (all labs ordered are listed, but only abnormal results are displayed) Labs Reviewed  COMPREHENSIVE METABOLIC PANEL - Abnormal; Notable  for the following components:      Result Value   Glucose, Bld 101 (*)    All other components within normal limits  URINALYSIS, ROUTINE W REFLEX MICROSCOPIC - Abnormal; Notable for the following components:   Leukocytes,Ua SMALL (*)    Bacteria, UA RARE (*)    All other components within normal limits  CBC WITH DIFFERENTIAL/PLATELET  CBG MONITORING, ED    EKG EKG Interpretation Date/Time:  Tuesday November 07 2023 17:04:12 EST Ventricular Rate:  105 PR Interval:  183 QRS Duration:  96 QT Interval:  326 QTC Calculation: 431 R Axis:   204  Text Interpretation: Sinus tachycardia Since last tracing rate faster Confirmed by Linwood Dibbles [phone]) on 11/07/2023 5:16:17 PM  Radiology DG Chest Portable 1 View  Result Date: 11/07/2023 CLINICAL DATA:  Fall, seizure EXAM: PORTABLE CHEST 1 VIEW COMPARISON:  06/20/2023 FINDINGS: Large hiatal hernia. Heart mediastinal contours within normal limits. Probable compressive atelectasis in the lungs adjacent to the large hernia. No acute confluent opacities or effusions. No acute bony abnormality. IMPRESSION: Large hiatal hernia with adjacent compressive atelectasis. No active disease. Electronically Signed   By: Charlett Nose M.D.   On: 11/07/2023 22:07   CT Head Wo Contrast  Result Date: 11/07/2023 CLINICAL DATA:  Seizure and fall with head strike EXAM: CT HEAD WITHOUT CONTRAST CT CERVICAL SPINE WITHOUT CONTRAST TECHNIQUE: Multidetector CT imaging of the head and cervical spine was performed following the standard protocol without intravenous contrast. Multiplanar CT image reconstructions of the cervical spine were also generated. RADIATION DOSE REDUCTION: This exam was performed according to the departmental dose-optimization program which includes automated exposure control, adjustment of the mA and/or kV according to patient size and/or use of iterative reconstruction technique. COMPARISON:  06/20/2023 CT head and cervical spine. FINDINGS: CT HEAD  FINDINGS Brain: No evidence of acute infarct, hemorrhage, mass, mass effect, or midline shift. No hydrocephalus or extra-axial fluid collection. Redemonstrated dilatation of the atria and occipital horns the lateral ventricles, consistent with colpocephaly. Unchanged left frontal lobe encephalomalacia. Vascular: No hyperdense vessel.  Skull: Negative for fracture or focal lesion. Sinuses/Orbits: Dysconjugate gaze.  No acute finding. Other: The mastoid air cells are well aerated. CT CERVICAL SPINE FINDINGS Alignment: No traumatic listhesis. Exaggeration of the normal cervical lordosis. Skull base and vertebrae: No acute fracture or suspicious osseous lesion. Disc height loss and endplate degenerative changes most prominently at C5-C6 and C6-C7. Soft tissues and spinal canal: No prevertebral fluid or swelling. No visible canal hematoma. Disc levels: Degenerative changes in the cervical spine.No high-grade spinal canal stenosis. Upper chest: No focal pulmonary opacity or pleural effusion. IMPRESSION: 1. No acute intracranial process. 2. No acute fracture or traumatic listhesis in the cervical spine. Electronically Signed   By: Wiliam Ke M.D.   On: 11/07/2023 21:13   CT Cervical Spine Wo Contrast  Result Date: 11/07/2023 CLINICAL DATA:  Seizure and fall with head strike EXAM: CT HEAD WITHOUT CONTRAST CT CERVICAL SPINE WITHOUT CONTRAST TECHNIQUE: Multidetector CT imaging of the head and cervical spine was performed following the standard protocol without intravenous contrast. Multiplanar CT image reconstructions of the cervical spine were also generated. RADIATION DOSE REDUCTION: This exam was performed according to the departmental dose-optimization program which includes automated exposure control, adjustment of the mA and/or kV according to patient size and/or use of iterative reconstruction technique. COMPARISON:  06/20/2023 CT head and cervical spine. FINDINGS: CT HEAD FINDINGS Brain: No evidence of acute  infarct, hemorrhage, mass, mass effect, or midline shift. No hydrocephalus or extra-axial fluid collection. Redemonstrated dilatation of the atria and occipital horns the lateral ventricles, consistent with colpocephaly. Unchanged left frontal lobe encephalomalacia. Vascular: No hyperdense vessel. Skull: Negative for fracture or focal lesion. Sinuses/Orbits: Dysconjugate gaze.  No acute finding. Other: The mastoid air cells are well aerated. CT CERVICAL SPINE FINDINGS Alignment: No traumatic listhesis. Exaggeration of the normal cervical lordosis. Skull base and vertebrae: No acute fracture or suspicious osseous lesion. Disc height loss and endplate degenerative changes most prominently at C5-C6 and C6-C7. Soft tissues and spinal canal: No prevertebral fluid or swelling. No visible canal hematoma. Disc levels: Degenerative changes in the cervical spine.No high-grade spinal canal stenosis. Upper chest: No focal pulmonary opacity or pleural effusion. IMPRESSION: 1. No acute intracranial process. 2. No acute fracture or traumatic listhesis in the cervical spine. Electronically Signed   By: Wiliam Ke M.D.   On: 11/07/2023 21:13    Procedures Procedures  {Document cardiac monitor, telemetry assessment procedure when appropriate:1}  Medications Ordered in ED Medications  LORazepam (ATIVAN) injection 1 mg (1 mg Intramuscular Given 11/07/23 1729)    ED Course/ Medical Decision Making/ A&P Clinical Course as of 11/07/23 2233  Tue Nov 07, 2023  2052 Urinalysis unremarkable.  CBC metabolic panel normal. [JK]  2214 Chest x-ray shows large hiatal hernia [JK]  2214 CT scan does not show any acute abnormality of head or C-spine [JK]    Clinical Course User Index [JK] Linwood Dibbles, MD   {   Click here for ABCD2, HEART and other calculatorsREFRESH Note before signing :1}                              Medical Decision Making Problems Addressed: Seizure [facility]): acute illness or injury that poses a threat to  life or bodily functions  Amount and/or Complexity of Data Reviewed Labs: ordered. Decision-making details documented in ED Course. Radiology: ordered and independent interpretation performed.  Risk Prescription drug management.   Patient presented to the ED for evaluation  of  {Document critical care time when appropriate:1} {Document review of labs and clinical decision tools ie heart score, Chads2Vasc2 etc:1}  {Document your independent review of radiology images, and any outside records:1} {Document your discussion with family members, caretakers, and with consultants:1} {Document social determinants of health affecting pt's care:1} {Document your decision making why or why not admission, treatments were needed:1} Final Clinical Impression(s) / ED Diagnoses Final diagnoses:  Seizure (HCC)    Rx / DC Orders ED Discharge Orders     None

## 2024-01-08 IMAGING — CT CT HEAD W/O CM
2 series · 15 of 37 positions shown, 18 images · non-contrast
Comparison: August 02, 2007.

CLINICAL DATA: Altered mental status.



[Series 2: head wo · axial · 0.40mm/px · z∈[-503,-368]mm · 12 of 33 slices shown, 15 images]
[im 3/33  brain]
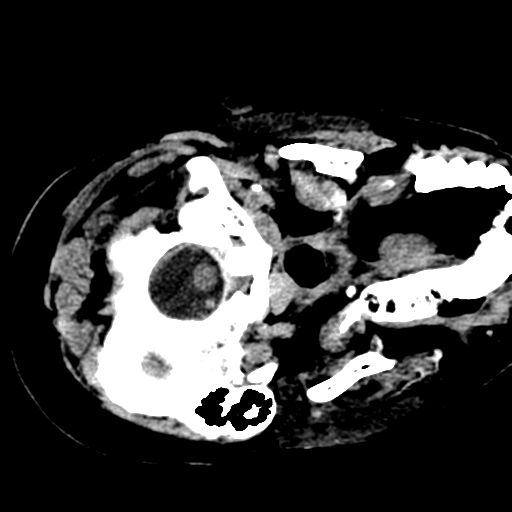
[im 3/33  bone]
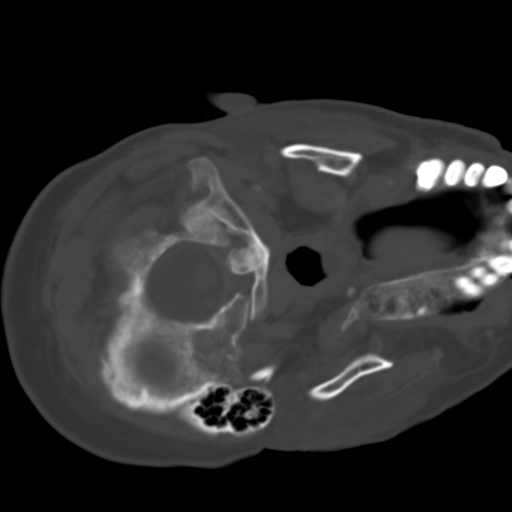
[im 5/33  brain]
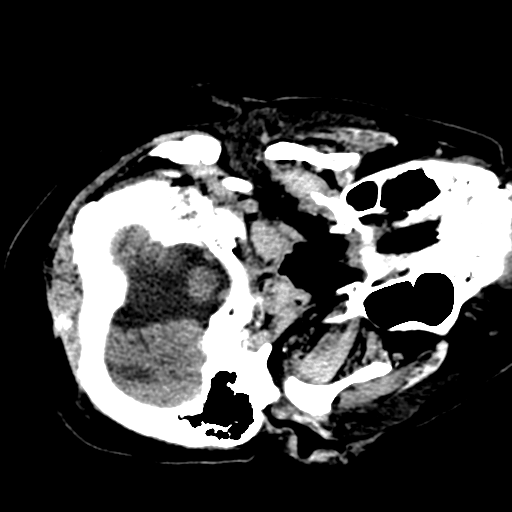
[im 7/33  brain]
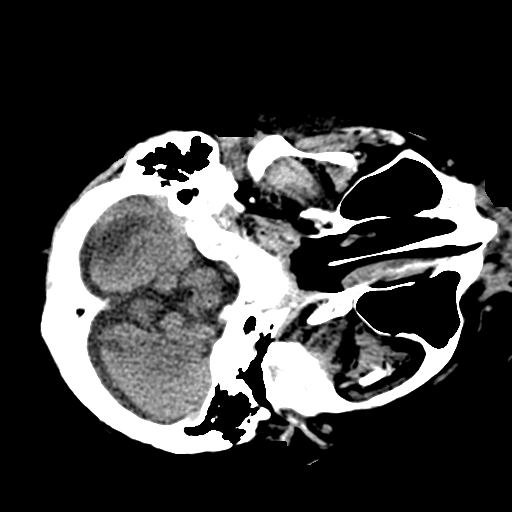
[im 10/33  brain]
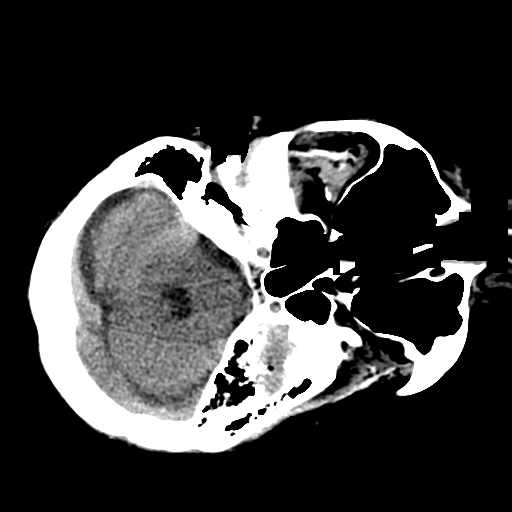
[im 13/33  brain]
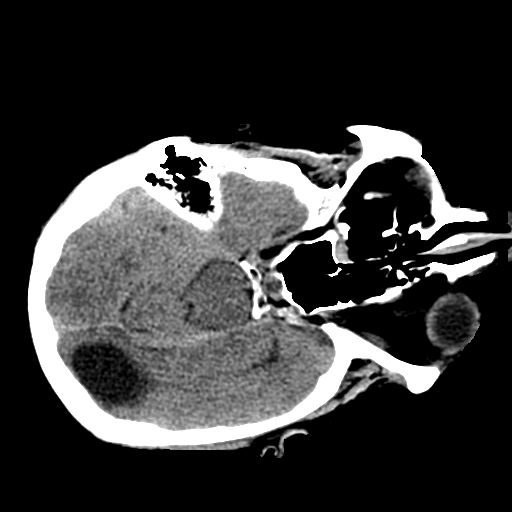
[im 13/33  bone]
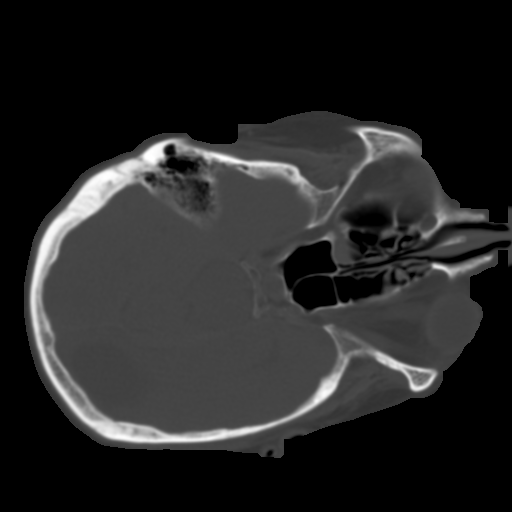
[im 15/33  brain]
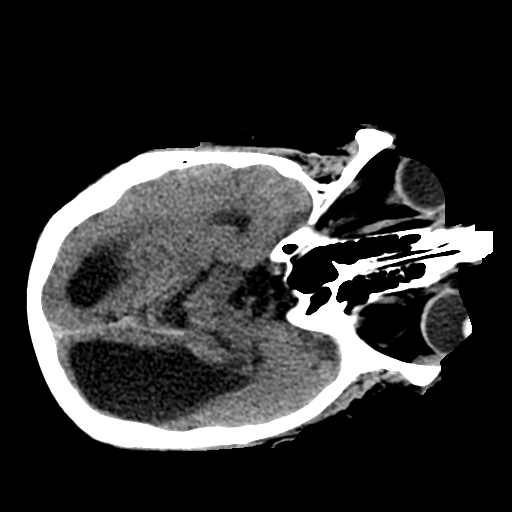
[im 18/33  brain]
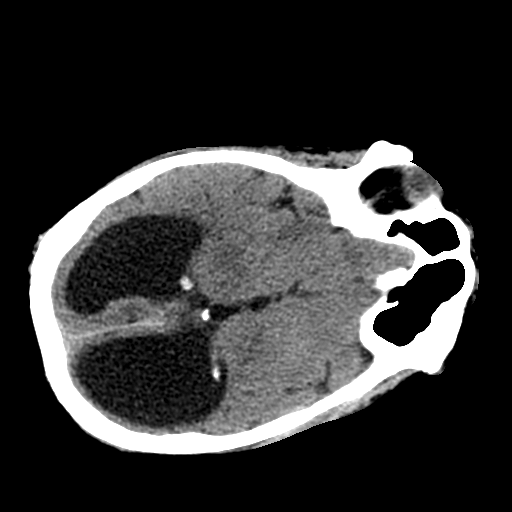
[im 20/33  brain]
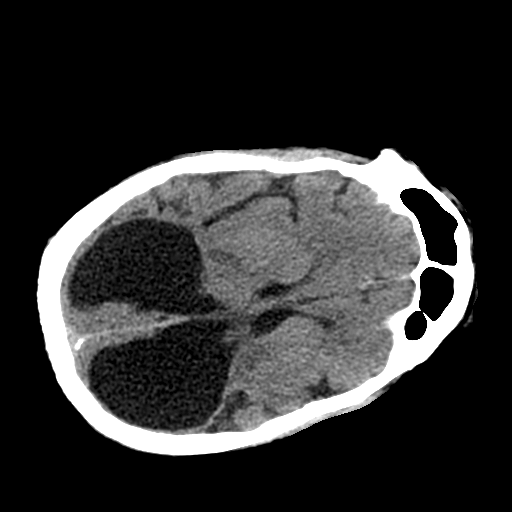
[im 23/33  brain]
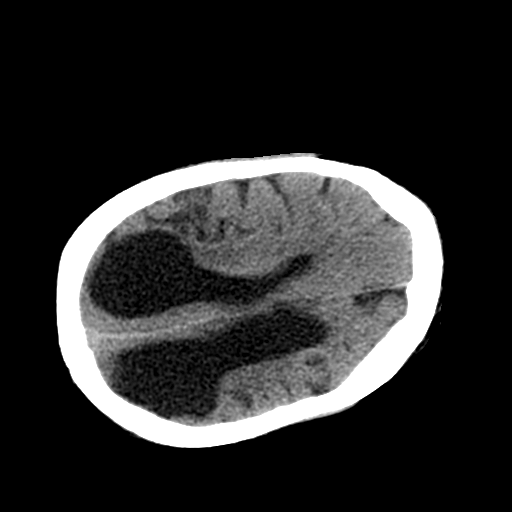
[im 23/33  bone]
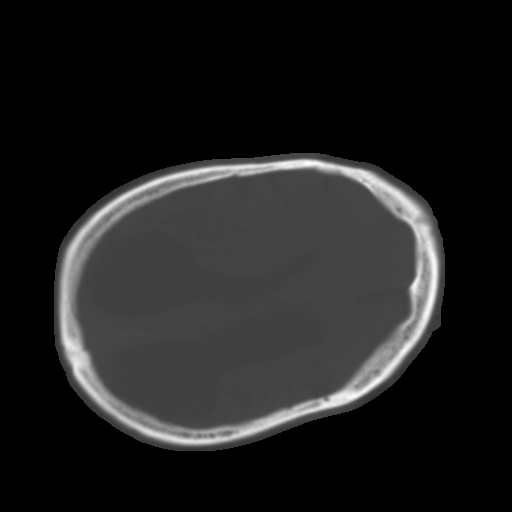
[im 26/33  brain]
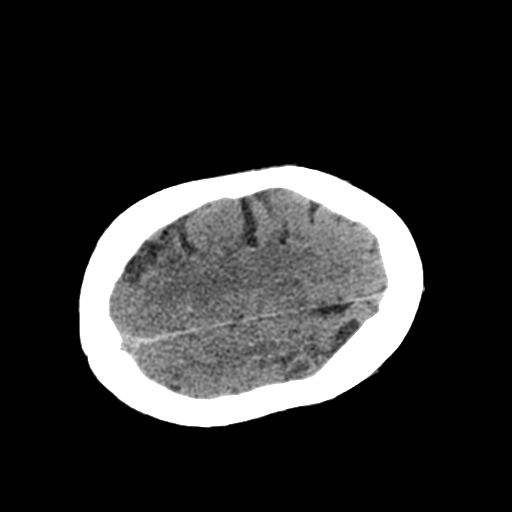
[im 28/33  brain]
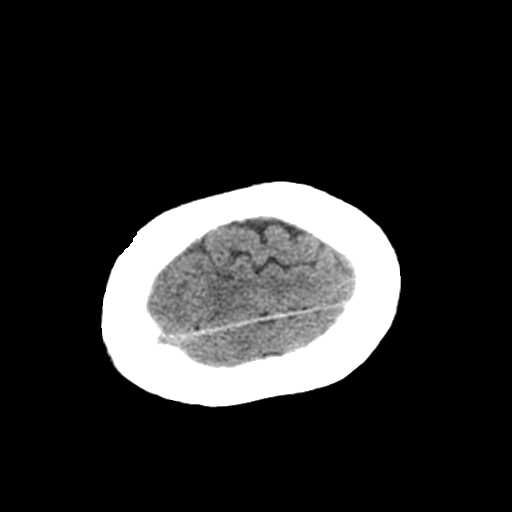
[im 30/33  brain]
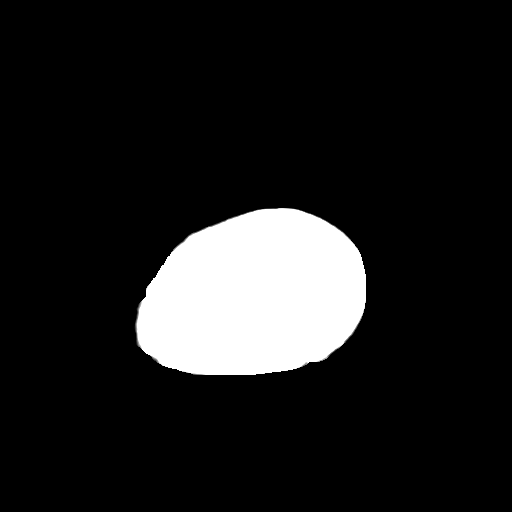

[Series 6: sagittal soft tissue · sagittal · 0.31mm/px · 3 of 52 slices shown]
[im 18/52  brain]
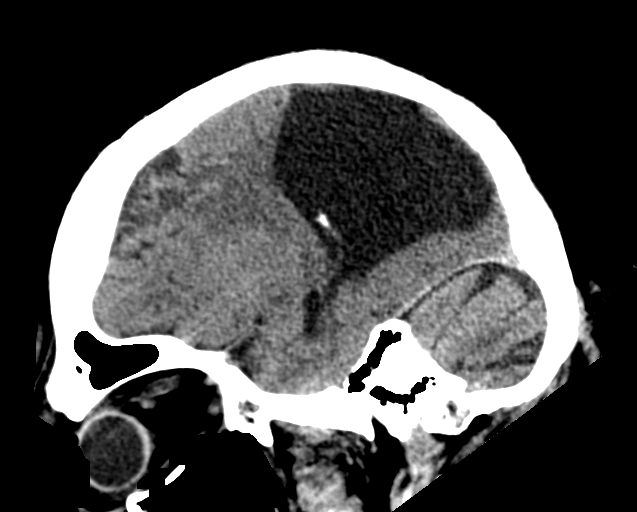
[im 26/52  brain]
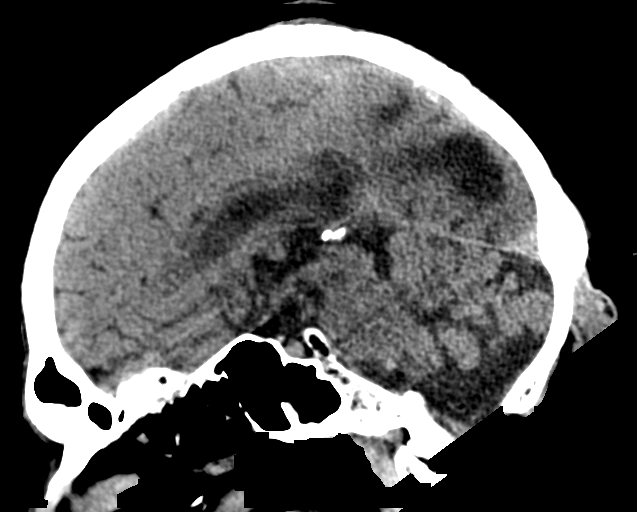
[im 35/52  brain]
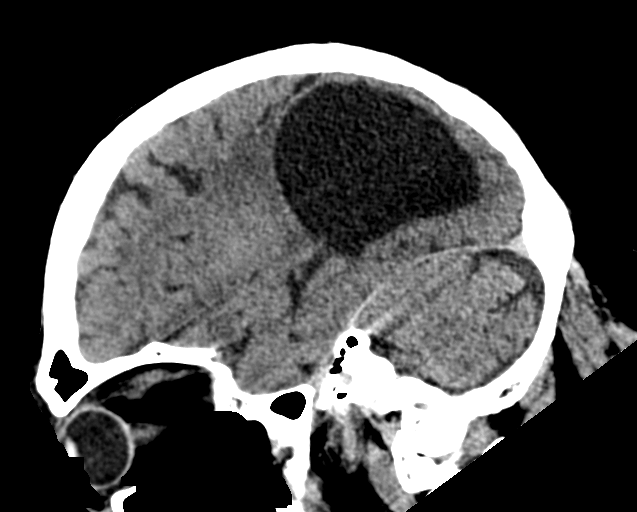

[15 of 37 positions shown; findings below may reference images not displayed]

FINDINGS: Brain: No mass effect or midline shift is noted. Severe dilatation
of the posterior portions of the lateral ventricles is noted with
overlying cortical atrophy which is unchanged compared to prior
exam. No definite hemorrhage, acute infarction or mass lesion is
noted. Stable left frontal encephalomalacia is noted.

Vascular: No hyperdense vessel or unexpected calcification.

Skull: Normal. Negative for fracture or focal lesion.

Sinuses/Orbits: No acute finding.

Other: None.
IMPRESSION: Stable colpocephaly and severe overlying cortical atrophy
posteriorly as described on prior exam. No acute abnormality is
noted.

## 2024-01-12 IMAGING — DX DG CHEST 1V PORT
1 series · 1 of 1 positions shown · non-contrast
Comparison: 03/22/2022

CLINICAL DATA: Acidosis.

EXAM:
PORTABLE CHEST 1 VIEW

[chest]
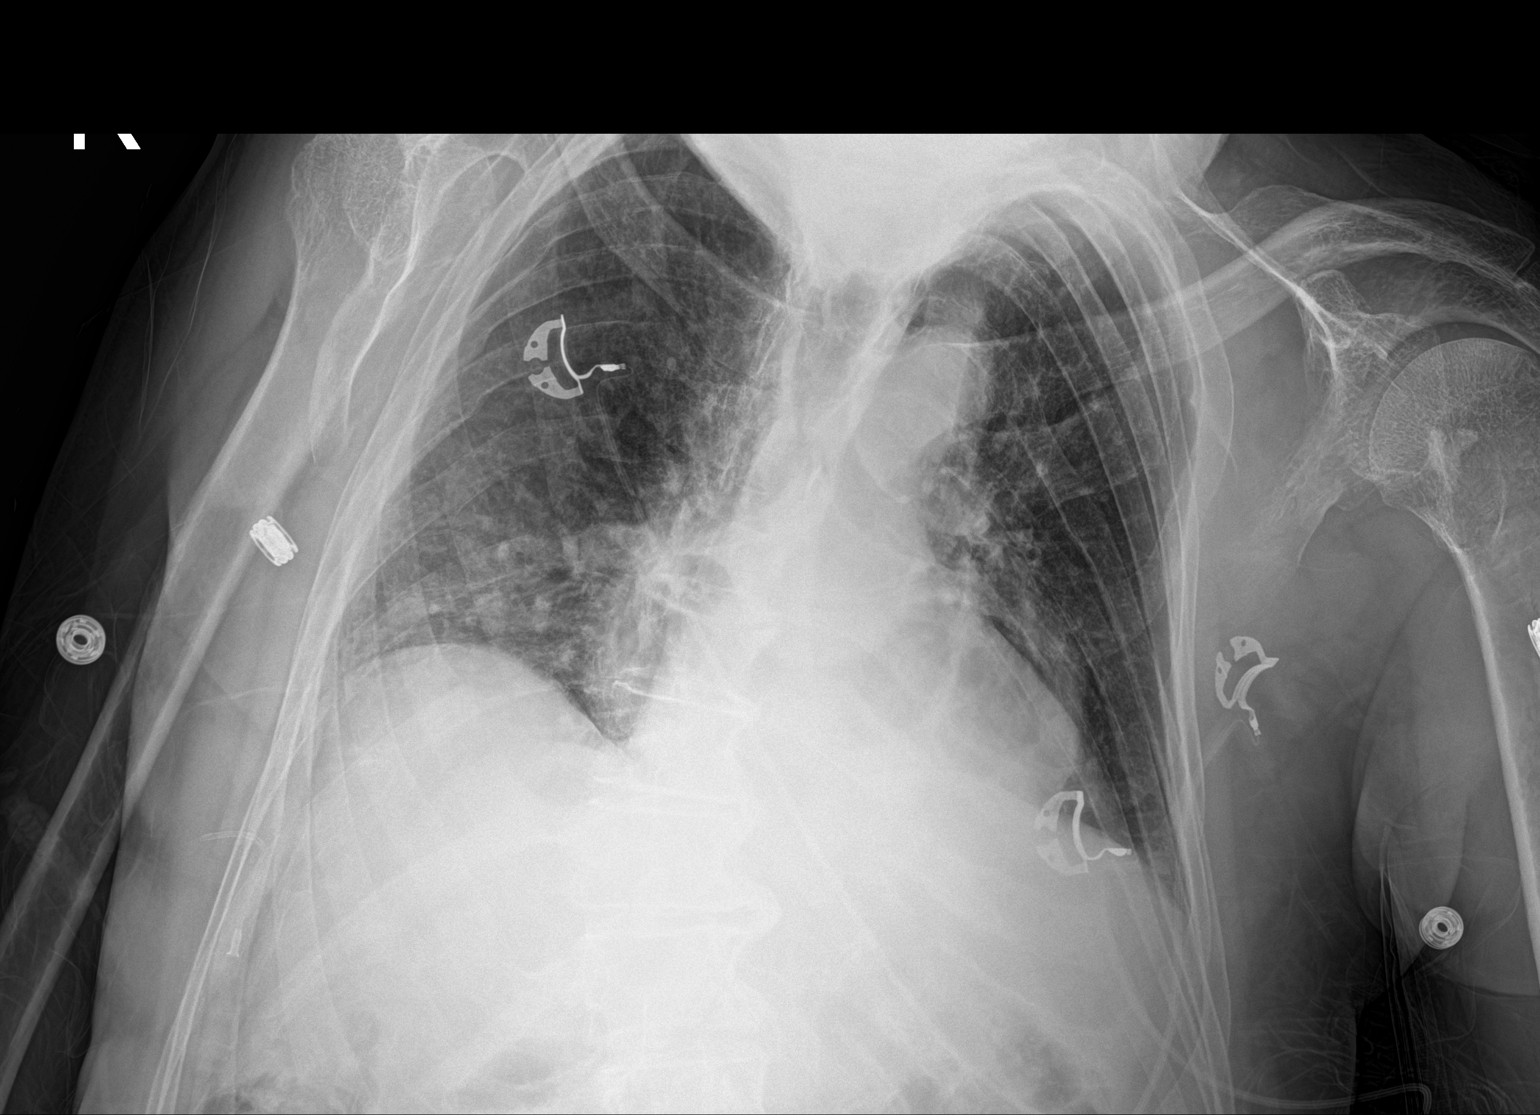

[1 of 1 positions shown; findings below may reference images not displayed]

FINDINGS: Patient is rotated to the left. Atelectasis or infiltrate is seen in
both lung bases no evidence of pleural effusion. Heart size is
within limits.
IMPRESSION: Mild bibasilar atelectasis versus infiltrates.

## 2024-02-16 ENCOUNTER — Emergency Department (HOSPITAL_COMMUNITY)
Admission: EM | Admit: 2024-02-16 | Discharge: 2024-02-16 | Disposition: A | Discharge status: Home / Self Care | Payer: Medicare Other | Attending: Emergency Medicine | Admitting: Emergency Medicine

## 2024-02-16 ENCOUNTER — Other Ambulatory Visit: Payer: Self-pay

## 2024-02-16 ENCOUNTER — Emergency Department (HOSPITAL_COMMUNITY): Payer: Medicare Other

## 2024-02-16 ENCOUNTER — Encounter (HOSPITAL_COMMUNITY): Payer: Self-pay | Admitting: *Deleted

## 2024-02-16 DIAGNOSIS — S0990XA Unspecified injury of head, initial encounter: Secondary | ICD-10-CM

## 2024-02-16 DIAGNOSIS — Y92009 Unspecified place in unspecified non-institutional (private) residence as the place of occurrence of the external cause: Secondary | ICD-10-CM | POA: Insufficient documentation

## 2024-02-16 DIAGNOSIS — W01198A Fall on same level from slipping, tripping and stumbling with subsequent striking against other object, initial encounter: Secondary | ICD-10-CM | POA: Insufficient documentation

## 2024-02-16 DIAGNOSIS — Z23 Encounter for immunization: Secondary | ICD-10-CM | POA: Insufficient documentation

## 2024-02-16 DIAGNOSIS — S0083XA Contusion of other part of head, initial encounter: Secondary | ICD-10-CM | POA: Diagnosis not present

## 2024-02-16 MED ORDER — TETANUS-DIPHTH-ACELL PERTUSSIS 5-2.5-18.5 LF-MCG/0.5 IM SUSY
0.5000 mL | PREFILLED_SYRINGE | Freq: Once | INTRAMUSCULAR | Status: AC
  Administered 2024-02-16: 0.5 mL via INTRAMUSCULAR
  Filled 2024-02-16: qty 0.5

## 2024-02-16 NOTE — ED Provider Notes (Signed)
 [place] EMERGENCY DEPARTMENT AT [facility] Provider Note   CSN: [id number] Arrival date & time: 02/16/24  1010     History  Chief Complaint  Patient presents with   Howard Best is a 73 y.o. male.  73 yo M with a chief complaints of a fall.  This was reported by his group home.  That he fell and struck his head on the left side.  He seems like he does not want to tell me the exact history.  He does mention at one point that he wants to leave the group home and seems upset.     Fall       Home Medications Prior to Admission medications   Medication Sig Start Date End Date Taking? Authorizing Provider  acetaminophen (TYLENOL) 160 MG/5ML solution Take 20 mLs by mouth every 6 (six) hours as needed.    [provider]  acetaminophen (TYLENOL) 325 MG tablet Take 650 mg by mouth every 4 (four) hours as needed.    [provider]  alum & mag hydroxide-simeth (MAALOX/MYLANTA) 200-200-20 MG/5ML suspension Take 15 mLs by mouth as needed for indigestion or heartburn.    [provider]  bisacodyl (FLEET) 10 MG/30ML ENEM Place 10 mg rectally as needed.    [provider]  Brompheniramine-Pseudoeph (DIMETAPP PO) Take 20 mLs by mouth as needed. Patient not taking: Reported on 08/02/2023    [provider]  Carbamide Peroxide-Saline (CLEARCANAL EAR WAX REMOVAL) 6.5 % KIT Place in ear(s) as needed.    [provider]  chlorhexidine (PERIDEX) 0.12 % solution Use as directed 5 mLs in the mouth or throat at bedtime.    [provider]  Cholecalciferol (VITAMIN D) 50 MCG (2000 UT) CAPS Take 2,000 Units by mouth daily.    [provider]  diazePAM (VALTOCO 5 MG DOSE) 5 MG/0.1ML LIQD Place 1 spray into the nose daily as needed (seizure). Patient not taking: Reported on 08/02/2023    [provider]  diazePAM, 15 MG Dose, (VALTOCO 15 MG DOSE) 2 x 7.5 MG/0.1ML LQPK Place 7.5 mg into the nose as  directed. As needed for seizure greater than or equal to 3 minutes. After 4 hours if seizure recurs, repeat dose. Max 1 episode eery 5 days. Alternate nostrils with each spray.    [provider]  Diethyltoluamide (OFF ACTIVE) 15 % AERO Apply topically as needed. Patient not taking: Reported on 08/02/2023    [provider]  diphenhydrAMINE (BENADRYL ALLERGY) 25 MG tablet Take 25 mg by mouth every 6 (six) hours as needed. Patient not taking: Reported on 08/02/2023    [provider]  diphenhydrAMINE (BENADRYL) 12.5 MG/5ML liquid Take 10 mLs by mouth every 6 (six) hours as needed.    [provider]  docusate sodium (COLACE) 100 MG capsule Take 100 mg by mouth daily.    [provider]  DULoxetine (CYMBALTA) 30 MG capsule Take 30 mg by mouth daily.    [provider]  guaiFENesin (ROBITUSSIN) 100 MG/5ML liquid Take 5 mLs by mouth every 4 (four) hours as needed for cough or to loosen phlegm.    [provider]  hydrocortisone cream 1 % Apply 1 Application topically as needed for itching.    [provider]  ibuprofen (ADVIL) 200 MG tablet Take 200-600 mg by mouth as needed. Patient not taking: Reported on 08/02/2023    [provider]  ketoconazole (NIZORAL) 2 % shampoo Apply  1 application. topically every Monday, Wednesday, and Friday.    [provider]  Lacosamide 100 MG TABS Take 1.5 tablets (150 mg total) by mouth in the morning and at bedtime. Patient taking differently: Take 200 mg by mouth in the morning and at bedtime. 03/26/23 03/20/24  Benjiman Core, MD  Lactobacillus (ACIDOPHILUS/BIFIDUS PO) Take 2 capsules by mouth daily.    [provider]  levETIRAcetam (KEPPRA) 750 MG tablet Take 2 tablets (1,500 mg total) by mouth 2 (two) times daily. 08/01/22 07/27/23  Windell Norfolk, MD  lisinopril (ZESTRIL) 10 MG tablet Take 10 mg by mouth every evening.    [provider]  loperamide (IMODIUM  A-D) 2 MG tablet Take 4 mg by mouth as needed for diarrhea or loose stools.    [provider]  LORazepam (ATIVAN) 1 MG tablet Take 2 mg by mouth as directed. Prior to dental procedures    [provider]  magnesium hydroxide (MILK OF MAGNESIA) 400 MG/5ML suspension Take 30 mLs by mouth as needed for mild constipation.    [provider]  mupirocin ointment (BACTROBAN) 2 % Apply 1 Application topically as needed. Patient not taking: Reported on 08/02/2023    [provider]  neomycin-bacitracin-polymyxin (NEOSPORIN) OINT Apply 1 Application topically as needed for wound care. Patient not taking: Reported on 08/02/2023    [provider]  polyethylene glycol (MIRALAX / GLYCOLAX) 17 g packet Take 17 g by mouth every other day.    [provider]  promethazine (PHENERGAN) 12.5 MG tablet Take 12.5 mg by mouth every 12 (twelve) hours as needed for nausea or vomiting.    [provider]  promethazine (PHENERGAN) 25 MG suppository Place 25 mg rectally every 6 (six) hours as needed for nausea or vomiting.    [provider]  Vitamins A & D (VITAMIN A & D) ointment Apply 1 Application topically as needed for dry skin. Patient not taking: Reported on 08/02/2023    [provider]  zinc oxide (BALMEX) 11.3 % CREA cream Apply 1 Application topically as needed. Patient not taking: Reported on 08/02/2023    [provider]      Allergies    Patient has no known allergies.    Review of Systems   Review of Systems  Physical Exam Updated Vital Signs BP 115/86 (BP Location: Right Arm)   Pulse 97   Temp 97.6 F (36.4 C) (Oral)   Resp 18   Ht 5\' 6"  (1.676 m)   SpO2 97%   BMI 21.47 kg/m  Physical Exam Vitals and nursing note reviewed.  Constitutional:      Appearance: He is well-developed.  HENT:     Head: Normocephalic.     Comments: Bruising noted to the left side of the head and also behind the left ear.  There  is a small amounts of blood on his scalp.  I do not appreciate any obvious laceration. Eyes:     Pupils: Pupils are equal, round, and reactive to light.  Neck:     Vascular: No JVD.  Cardiovascular:     Rate and Rhythm: Normal rate and regular rhythm.     Heart sounds: No murmur heard.    No friction rub. No gallop.  Pulmonary:     Effort: No respiratory distress.     Breath sounds: No wheezing.  Abdominal:     General: There is no distension.     Tenderness: There is no abdominal tenderness. There is no  guarding or rebound.  Musculoskeletal:        General: Normal range of motion.     Cervical back: Normal range of motion and neck supple.     Comments: Bilateral leg contractures  Skin:    Coloration: Skin is not pale.     Findings: No rash.  Neurological:     Mental Status: He is alert and oriented to person, place, and time.  Psychiatric:        Behavior: Behavior normal.     ED Results / Procedures / Treatments   Labs (all labs ordered are listed, but only abnormal results are displayed) Labs Reviewed - No data to display  EKG None  Radiology CT Head Wo Contrast Result Date: 02/16/2024 CLINICAL DATA:  Head trauma, minor (Age >= 65y); Neck trauma (Age >= 65y) EXAM: CT HEAD WITHOUT CONTRAST CT CERVICAL SPINE WITHOUT CONTRAST TECHNIQUE: Multidetector CT imaging of the head and cervical spine was performed following the standard protocol without intravenous contrast. Multiplanar CT image reconstructions of the cervical spine were also generated. RADIATION DOSE REDUCTION: This exam was performed according to the departmental dose-optimization program which includes automated exposure control, adjustment of the mA and/or kV according to patient size and/or use of iterative reconstruction technique. COMPARISON:  CT head and cervical spine 11/07/2023 FINDINGS: CT HEAD FINDINGS Brain: No hemorrhage. No hydrocephalus. Extra-axial fluid collection. No mass effect. No mass lesion.  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 Vascular: No hyperdense vessel or unexpected calcification. Skull: Soft tissue laceration along the lateral left scalp. No evidence of underlying calvarial fracture. Sinuses/Orbits: No middle ear or mastoid effusion. Paranasal sinuses are clear orbits are unremarkable. Other: None. CT CERVICAL SPINE FINDINGS Alignment: Normal. Skull base and vertebrae: No acute fracture. No primary bone lesion or focal pathologic process. Soft tissues and spinal canal: No prevertebral fluid or swelling. No visible canal hematoma. Disc levels:  No CT evidence of high-grade spinal canal stenosis Upper chest: Negative Other: None IMPRESSION: 1. No acute intracranial abnormality. 2. Soft tissue laceration along the lateral left scalp. No evidence of underlying calvarial fracture. 3. No acute fracture or traumatic subluxation of the cervical spine. Electronically Signed   By: Lorenza Cambridge M.D.   On: 02/16/2024 12:57   CT Cervical Spine Wo Contrast Result Date: 02/16/2024 CLINICAL DATA:  Head trauma, minor (Age >= 65y); Neck trauma (Age >= 65y) EXAM: CT HEAD WITHOUT CONTRAST CT CERVICAL SPINE WITHOUT CONTRAST TECHNIQUE: Multidetector CT imaging of the head and cervical spine was performed following the standard protocol without intravenous contrast. Multiplanar CT image reconstructions of the cervical spine were also generated. RADIATION DOSE REDUCTION: This exam was performed according to the departmental dose-optimization program which includes automated exposure control, adjustment of the mA and/or kV according to patient size and/or use of iterative reconstruction technique. COMPARISON:  CT head and cervical spine 11/07/2023 FINDINGS: CT HEAD FINDINGS Brain: No hemorrhage. No hydrocephalus. Extra-axial fluid collection. No mass effect. No mass lesion. 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 Vascular: No hyperdense vessel or unexpected calcification. Skull: Soft tissue laceration along the lateral left scalp. No evidence of underlying calvarial fracture. Sinuses/Orbits: No middle ear or mastoid effusion. Paranasal sinuses are clear orbits are unremarkable. Other: None. CT CERVICAL SPINE FINDINGS Alignment: Normal. Skull base and vertebrae: No acute fracture. No primary bone lesion or focal pathologic process. Soft tissues and spinal  canal: No prevertebral fluid or swelling. No visible canal hematoma. Disc levels:  No CT evidence of high-grade spinal canal stenosis Upper chest: Negative Other: None IMPRESSION: 1. No acute intracranial abnormality. 2. Soft tissue laceration along the lateral left scalp. No evidence of underlying calvarial fracture. 3. No acute fracture or traumatic subluxation of the cervical spine. Electronically Signed   By: Lorenza Cambridge M.D.   On: 02/16/2024 12:57    Procedures Procedures    Medications Ordered in ED Medications  Tdap (BOOSTRIX) injection 0.5 mL (0.5 mLs Intramuscular Given 02/16/24 1100)    ED Course/ Medical Decision Making/ A&P                                 Medical Decision Making Amount and/or Complexity of Data Reviewed Radiology: ordered.  Risk Prescription drug management.   73 yo M with a significant past medical history of cerebral palsy who lives in a group home came with a chief complaints of a fall.  Reportedly had fallen and struck the left side of his head.  Patient is unable or unwilling much history.  Will obtain CT imaging of head and C-spine.  No other obvious areas of discomfort.  CT of the head and C-spine without obvious acute pathology.  Will discharge the patient home.  1:21 PM:  I have discussed the diagnosis/risks/treatment options with the patient.  Evaluation and diagnostic testing in the emergency department does not suggest an emergent condition requiring  admission or immediate intervention beyond what has been performed at this time.  They will follow up with PCP. We also discussed returning to the ED immediately if new or worsening sx occur. We discussed the sx which are most concerning (e.g., sudden worsening pain, fever, inability to tolerate by mouth) that necessitate immediate return. Medications administered to the patient during their visit and any new prescriptions provided to the patient are listed below.  Medications given during this visit Medications  Tdap (BOOSTRIX) injection 0.5 mL (0.5 mLs Intramuscular Given 02/16/24 1100)     The patient appears reasonably screen and/or stabilized for discharge and I doubt any other medical condition or other [facility] requiring further screening, evaluation, or treatment in the ED at this time prior to discharge.          Final Clinical Impression(s) / ED Diagnoses Final diagnoses:  Closed head injury, initial encounter    Rx / DC Orders ED Discharge Orders     None         Melene Plan, DO 02/16/24 1321

## 2024-02-16 NOTE — ED Notes (Signed)
 Pt transported to CT via stretcher.

## 2024-02-16 NOTE — ED Triage Notes (Signed)
 BIB EMS after a fall this morning at his group home,  [facility] on [address] pt hit his head on left side, pt is upset stating the lady let him fall, small lac with bleeding controlled. No LOC 136/80-105-97% RA

## 2024-02-16 NOTE — Discharge Instructions (Signed)
 Your CT images look okay.  There is no bleeding inside the skull and no broken bones in your neck.  Please follow-up with your family doctor in the office.

## 2024-02-16 NOTE — ED Notes (Signed)
 AVS provided to and discussed with patient and caregiver at bedside. Pt's caregiver verbalizes understanding of discharge instructions and denies any questions or concerns at this time. Pt to be transported home via wheelchair van by group home staff.

## 2024-04-02 ENCOUNTER — Other Ambulatory Visit: Payer: Self-pay

## 2024-04-02 ENCOUNTER — Emergency Department (HOSPITAL_COMMUNITY)

## 2024-04-02 ENCOUNTER — Encounter (HOSPITAL_COMMUNITY): Payer: Self-pay

## 2024-04-02 ENCOUNTER — Emergency Department (HOSPITAL_COMMUNITY)
Admission: EM | Admit: 2024-04-02 | Discharge: 2024-04-03 | Disposition: A | Discharge status: Skilled Nursing Facility | Attending: Emergency Medicine | Admitting: Emergency Medicine

## 2024-04-02 DIAGNOSIS — W07XXXA Fall from chair, initial encounter: Secondary | ICD-10-CM | POA: Diagnosis not present

## 2024-04-02 DIAGNOSIS — S01511A Laceration without foreign body of lip, initial encounter: Secondary | ICD-10-CM | POA: Insufficient documentation

## 2024-04-02 DIAGNOSIS — S0990XA Unspecified injury of head, initial encounter: Secondary | ICD-10-CM

## 2024-04-02 DIAGNOSIS — W19XXXA Unspecified fall, initial encounter: Secondary | ICD-10-CM

## 2024-04-02 MED ORDER — ZIPRASIDONE MESYLATE 20 MG IM SOLR
20.0000 mg | Freq: Once | INTRAMUSCULAR | Status: AC
  Administered 2024-04-02: 20 mg via INTRAMUSCULAR
  Filled 2024-04-02: qty 20

## 2024-04-02 MED ORDER — ZIPRASIDONE MESYLATE 20 MG IM SOLR
20.0000 mg | Freq: Once | INTRAMUSCULAR | Status: DC
  Filled 2024-04-02: qty 20

## 2024-04-02 MED ORDER — STERILE WATER FOR INJECTION IJ SOLN
INTRAMUSCULAR | Status: AC
  Administered 2024-04-02: 2 mL
  Filled 2024-04-02: qty 10

## 2024-04-02 NOTE — ED Triage Notes (Signed)
 Pt BIB EMS from [facility] due to an unwitnessed fall. Pt was sit in a chair when he fell to the left flipping pt chair landing on the ground; pt normal leans to left side at baseline. No LOC, no blood thinners, pt did hit his head on ground; small red area noted. Pt was having a dental work performed when he fell; pt has blood on face and month from perform according to EMS. Hx of seizure, cerebral palsy, Intellectual Disability. Staff at facility report pt currently at baseline behavior. Staff reports pt was sedated. Pt currently sleeping.

## 2024-04-02 NOTE — ED Provider Notes (Signed)
 3:04 PM Patient signed out to me by previous ED physician. Pt is a 73 yo male with pmh cerebral palsy presenting for syncope at group home after returning from dental procedure.    Physical Exam  BP (!) 152/94   Pulse 97   Temp 98 F (36.7 C) (Oral)   Resp 18   SpO2 100%   Physical Exam  Procedures  Procedures  ED Course / MDM    Medical Decision Making Amount and/or Complexity of Data Reviewed Radiology: ordered.  Risk Prescription drug management.   Patient sent back from CT imaging due to inability to perform exam due to movement.  Geodon given.  CT head, neck, and face demonstrates no acute process.  Patient remains neurovascular intact and safe for discharge back to group home at this time.  Patient in no distress and overall condition improved here in the ED. Detailed discussions were had with the patient regarding current findings, and need for close f/u with PCP or on call doctor. The patient has been instructed to return immediately if the symptoms worsen in any way for re-evaluation. Patient verbalized understanding and is in agreement with current care plan. All questions answered prior to discharge.        Quinn Bucco, DO 04/02/24 1839

## 2024-04-02 NOTE — ED Provider Notes (Signed)
 [place] EMERGENCY DEPARTMENT AT [facility] Provider Note   CSN: [id number] Arrival date & time: 04/02/24  1209     History  Chief Complaint  Patient presents with   Fall   Head Injury    Howard Best is a 73 y.o. male.  73 year old male here today after he had a fall.  Patient is a group home resident, history of cerebral palsy, developmental delay.  Patient had just received sedation from the dentist, returned back to group home, fell out of his chair and struck his head.  He is not on blood thinners.  Additional history obtained via group home staff at bedside.  Medication list reviewed.   Fall  Head Injury      Home Medications Prior to Admission medications   Medication Sig Start Date End Date Taking? Authorizing Provider  acetaminophen (TYLENOL) 160 MG/5ML solution Take 20 mLs by mouth every 6 (six) hours as needed.    [provider]  acetaminophen (TYLENOL) 325 MG tablet Take 650 mg by mouth every 4 (four) hours as needed.    [provider]  alum & mag hydroxide-simeth (MAALOX/MYLANTA) 200-200-20 MG/5ML suspension Take 15 mLs by mouth as needed for indigestion or heartburn.    [provider]  bisacodyl (FLEET) 10 MG/30ML ENEM Place 10 mg rectally as needed.    [provider]  Brompheniramine-Pseudoeph (DIMETAPP PO) Take 20 mLs by mouth as needed. Patient not taking: Reported on 08/02/2023    [provider]  Carbamide Peroxide-Saline (CLEARCANAL EAR WAX REMOVAL) 6.5 % KIT Place in ear(s) as needed.    [provider]  chlorhexidine (PERIDEX) 0.12 % solution Use as directed 5 mLs in the mouth or throat at bedtime.    [provider]  Cholecalciferol (VITAMIN D) 50 MCG (2000 UT) CAPS Take 2,000 Units by mouth daily.    [provider]  diazePAM (VALTOCO 5 MG DOSE) 5 MG/0.1ML LIQD Place 1 spray into the nose daily as needed (seizure). Patient not taking: Reported on 08/02/2023     [provider]  diazePAM, 15 MG Dose, (VALTOCO 15 MG DOSE) 2 x 7.5 MG/0.1ML LQPK Place 7.5 mg into the nose as directed. As needed for seizure greater than or equal to 3 minutes. After 4 hours if seizure recurs, repeat dose. Max 1 episode eery 5 days. Alternate nostrils with each spray.    [provider]  Diethyltoluamide (OFF ACTIVE) 15 % AERO Apply topically as needed. Patient not taking: Reported on 08/02/2023    [provider]  diphenhydrAMINE (BENADRYL ALLERGY) 25 MG tablet Take 25 mg by mouth every 6 (six) hours as needed. Patient not taking: Reported on 08/02/2023    [provider]  diphenhydrAMINE (BENADRYL) 12.5 MG/5ML liquid Take 10 mLs by mouth every 6 (six) hours as needed.    [provider]  docusate sodium (COLACE) 100 MG capsule Take 100 mg by mouth daily.    [provider]  DULoxetine (CYMBALTA) 30 MG capsule Take 30 mg by mouth daily.    [provider]  guaiFENesin (ROBITUSSIN) 100 MG/5ML liquid Take 5 mLs by mouth every 4 (four) hours as needed for cough or to loosen phlegm.    [provider]  hydrocortisone cream 1 % Apply 1 Application topically as needed for itching.    [provider]  ibuprofen (ADVIL) 200 MG tablet Take 200-600 mg by mouth as needed. Patient not taking: Reported on 08/02/2023    [provider]  ketoconazole (NIZORAL) 2 % shampoo Apply 1 application. topically every Monday, Wednesday, and Friday.    [provider]  Lacosamide 100 MG TABS Take 1.5 tablets (150 mg total) by mouth in the morning and at bedtime. Patient taking differently: Take 200 mg by mouth in the morning and at bedtime. 03/26/23 03/20/24  Benjiman Core, MD  Lactobacillus (ACIDOPHILUS/BIFIDUS PO) Take 2 capsules by mouth daily.    [provider]  levETIRAcetam (KEPPRA) 750 MG tablet Take 2 tablets (1,500 mg total) by mouth 2 (two) times daily. 08/01/22 07/27/23  Windell Norfolk, MD   lisinopril (ZESTRIL) 10 MG tablet Take 10 mg by mouth every evening.    [provider]  loperamide (IMODIUM A-D) 2 MG tablet Take 4 mg by mouth as needed for diarrhea or loose stools.    [provider]  LORazepam (ATIVAN) 1 MG tablet Take 2 mg by mouth as directed. Prior to dental procedures    [provider]  magnesium hydroxide (MILK OF MAGNESIA) 400 MG/5ML suspension Take 30 mLs by mouth as needed for mild constipation.    [provider]  mupirocin ointment (BACTROBAN) 2 % Apply 1 Application topically as needed. Patient not taking: Reported on 08/02/2023    [provider]  neomycin-bacitracin-polymyxin (NEOSPORIN) OINT Apply 1 Application topically as needed for wound care. Patient not taking: Reported on 08/02/2023    [provider]  polyethylene glycol (MIRALAX / GLYCOLAX) 17 g packet Take 17 g by mouth every other day.    [provider]  promethazine (PHENERGAN) 12.5 MG tablet Take 12.5 mg by mouth every 12 (twelve) hours as needed for nausea or vomiting.    [provider]  promethazine (PHENERGAN) 25 MG suppository Place 25 mg rectally every 6 (six) hours as needed for nausea or vomiting.    [provider]  Vitamins A & D (VITAMIN A & D) ointment Apply 1 Application topically as needed for dry skin. Patient not taking: Reported on 08/02/2023    [provider]  zinc oxide (BALMEX) 11.3 % CREA cream Apply 1 Application topically as needed. Patient not taking: Reported on 08/02/2023    [provider]      Allergies    Patient has no known allergies.    Review of Systems   Review of Systems  Physical Exam Updated Vital Signs BP (!) 152/94   Pulse 97   Temp 98 F (36.7 C) (Oral)   Resp 18   SpO2 100%  Physical Exam Vitals reviewed.  HENT:     Head:     Comments: Contusion to the middle forehead.  No laceration.    Mouth/Throat:     Comments: Small laceration on the  inside lower lip.  Bleeding controlled.  No injury to tongue.  Teeth appear stable. Musculoskeletal:     Cervical back: Normal range of motion and neck supple.  Neurological:     Mental Status: He is alert.     ED Results / Procedures / Treatments   Labs (all labs ordered are listed, but only abnormal results are displayed) Labs Reviewed - No data to display  EKG None  Radiology No results found.  Procedures Procedures    Medications Ordered in ED Medications - No data to display  ED Course/ Medical Decision Making/ A&P  Medical Decision Making 17-year-old male here today after he was sedated, fell from a chair.  Plan-on exam, patient with a contusion to the forehead, mild oral trauma.  Will obtain imaging of the patient's head, neck, face.  Considered labs in this patient, however with his history, exam and reassuring vital signs do not believe is indicated at this time.  This patient's health care is complicated by the following social determinants of health-developmental delay, cerebral palsy.  Reassessment 2:20 PM-patient be signed out to Dr. Martina Sledge pending CT imaging of the head, face and neck.  Amount and/or Complexity of Data Reviewed Radiology: ordered.           Final Clinical Impression(s) / ED Diagnoses Final diagnoses:  None    Rx / DC Orders ED Discharge Orders     None         Nathanael Baker, DO 04/02/24 1421

## 2024-04-02 NOTE — ED Notes (Signed)
 Geodon medication recon with too much fluid reordering.

## 2024-04-17 ENCOUNTER — Emergency Department (HOSPITAL_COMMUNITY)

## 2024-04-17 ENCOUNTER — Other Ambulatory Visit: Payer: Self-pay

## 2024-04-17 ENCOUNTER — Emergency Department (HOSPITAL_COMMUNITY)
Admission: EM | Admit: 2024-04-17 | Discharge: 2024-04-17 | Disposition: A | Discharge status: Home / Self Care | Attending: Emergency Medicine | Admitting: Emergency Medicine

## 2024-04-17 DIAGNOSIS — R569 Unspecified convulsions: Secondary | ICD-10-CM | POA: Insufficient documentation

## 2024-04-17 LAB — CBC WITH DIFFERENTIAL/PLATELET
Abs Immature Granulocytes: 0.01 10*3/uL (ref 0.00–0.07)
Basophils Absolute: 0.1 10*3/uL (ref 0.0–0.1)
Basophils Relative: 1 %
Eosinophils Absolute: 0.1 10*3/uL (ref 0.0–0.5)
Eosinophils Relative: 1 %
HCT: 46 % (ref 39.0–52.0)
Hemoglobin: 15.4 g/dL (ref 13.0–17.0)
Immature Granulocytes: 0 %
Lymphocytes Relative: 25 %
Lymphs Abs: 2.2 10*3/uL (ref 0.7–4.0)
MCH: 32.3 pg (ref 26.0–34.0)
MCHC: 33.5 g/dL (ref 30.0–36.0)
MCV: 96.4 fL (ref 80.0–100.0)
Monocytes Absolute: 0.9 10*3/uL (ref 0.1–1.0)
Monocytes Relative: 11 %
Neutro Abs: 5.4 10*3/uL (ref 1.7–7.7)
Neutrophils Relative %: 62 %
Platelets: 235 10*3/uL (ref 150–400)
RBC: 4.77 MIL/uL (ref 4.22–5.81)
RDW: 12.5 % (ref 11.5–15.5)
WBC: 8.7 10*3/uL (ref 4.0–10.5)
nRBC: 0 % (ref 0.0–0.2)

## 2024-04-17 LAB — COMPREHENSIVE METABOLIC PANEL WITH GFR
ALT: 19 U/L (ref 0–44)
AST: 23 U/L (ref 15–41)
Albumin: 3.9 g/dL (ref 3.5–5.0)
Alkaline Phosphatase: 57 U/L (ref 38–126)
Anion gap: 8 (ref 5–15)
BUN: 14 mg/dL (ref 8–23)
CO2: 27 mmol/L (ref 22–32)
Calcium: 9 mg/dL (ref 8.9–10.3)
Chloride: 103 mmol/L (ref 98–111)
Creatinine, Ser: 0.82 mg/dL (ref 0.61–1.24)
GFR, Estimated: >60 mL/min (ref >60)
Glucose, Bld: 95 mg/dL (ref 70–99)
Potassium: 4.6 mmol/L (ref 3.5–5.1)
Sodium: 138 mmol/L (ref 135–145)
Total Bilirubin: 0.7 mg/dL (ref 0.0–1.2)
Total Protein: 6.7 g/dL (ref 6.5–8.1)

## 2024-04-17 NOTE — ED Notes (Addendum)
 Patient tranported back from CT

## 2024-04-17 NOTE — ED Notes (Signed)
 Patient transported to CT

## 2024-04-17 NOTE — ED Notes (Signed)
 Patient assisted into wheelchair with the assistance of RN and Caregiver. Notified brother of discharge. Returning to facility.

## 2024-04-17 NOTE — ED Provider Notes (Signed)
 [facility] Provider Note   CSN: [id number] Arrival date & time: 04/17/24  1631     History Chief Complaint  Patient presents with   Seizures    Pt BIB EMS from group home for multiple seizures. Staff reports 11 seizures since 11:22am. Initial seizures described as "full body shakes" repeat seizures "look like he's bearing down for 10 seconds." Pt A&Ox4. Pt refused all treatment by EMS.     HPI Howard Best is a 73 y.o. male presenting for chief complaint of seizure like episodes.  The description from EMS is very atypical per the facility he stares off into space for a few seconds.  Questionable full body shakes though intermittent in nature. Patient's recorded medical, surgical, social, medication list and allergies were reviewed in the Snapshot window as part of the initial history.  Notably, I called both family members listed as his legal guardian at 5 PM and 5:14 PM respectively with no answer on either phone number. Review of Systems   Review of Systems  Unable to perform ROS: Patient nonverbal    Physical Exam Updated Vital Signs BP 134/77 (BP Location: Right Arm)   Pulse 90   Temp 98 F (36.7 C) (Oral)   Resp 16   Ht 5' (1.524 m)   Wt 59 kg   SpO2 99%   BMI 25.39 kg/m  Physical Exam Vitals and nursing note reviewed.  Constitutional:      General: He is not in acute distress.    Appearance: He is well-developed.  HENT:     Head: Normocephalic and atraumatic.  Eyes:     Conjunctiva/sclera: Conjunctivae normal.  Cardiovascular:     Rate and Rhythm: Normal rate and regular rhythm.  Pulmonary:     Effort: Pulmonary effort is normal. No respiratory distress.  Abdominal:     General: Abdomen is flat. There is no distension.  Musculoskeletal:        General: No swelling or deformity.  Skin:    General: Skin is warm and dry.     Capillary Refill: Capillary refill takes less than 2 seconds.  Neurological:      General: No focal deficit present.     Mental Status: He is alert.      ED Course/ Medical Decision Making/ A&P    Procedures Procedures   Medications Ordered in ED Medications - No data to display  Medical Decision Making:   TZVI POTT is a 73 y.o. male who presented to the ED today with seizure episodes detailed above.    Handoff received from EMS.  Patient placed on continuous vitals and telemetry monitoring while in ED which was reviewed periodically.  Complete initial physical exam performed, notably the patient  was hemodynamically stable no acute distress.    Reviewed and confirmed nursing documentation for past medical history, family history, social history.    Initial Assessment:   With the patient's presentation of seizure episodes, most likely diagnosis is breakthrough from his home medication regimen. Other diagnoses were considered including (but not limited to) metabolic crisis, intracranial hemorrhage, CVA, intracranial mass. These are considered less likely due to history of present illness and physical exam findings.   This is most consistent with an acute life/limb threatening illness complicated by underlying chronic conditions.  Initial Plan:  CT head for evaluation of intracranial pathology Screening labs including CBC and Metabolic panel to evaluate for infectious or metabolic etiology of disease.  Objective evaluation  as below reviewed with plan for observation while here in the emergency room  Initial Study Results:   Laboratory  All laboratory results reviewed without evidence of clinically relevant pathology.   EKG EKG was reviewed independently. Rate, rhythm, axis, intervals all examined and without medically relevant abnormality. ST segments without concerns for elevations.    Radiology:  All images reviewed independently. Agree with radiology report at this time.   CT HEAD WO CONTRAST ( ) Result Date: 04/17/2024 CLINICAL DATA:  Head  trauma EXAM: CT HEAD WITHOUT CONTRAST TECHNIQUE: Contiguous axial images were obtained from the base of the skull through the vertex without intravenous contrast. RADIATION DOSE REDUCTION: This exam was performed according to the departmental dose-optimization program which includes automated exposure control, adjustment of the mA and/or kV according to patient size and/or use of iterative reconstruction technique. COMPARISON:  04/02/2024 FINDINGS: Brain: No acute infarct or hemorrhage. 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 Overlying cortical atrophy. Remaining midline structures are unremarkable. No acute extra-axial fluid collections. No mass effect. Vascular: No hyperdense vessel or unexpected calcification. Skull: Normal. Negative for fracture or focal lesion. Sinuses/Orbits: No acute finding. Other: None. IMPRESSION: 1. Stable head CT, no acute intracranial process. Electronically Signed   By: Bobbye Burrow M.D.   On: 04/17/2024 19:51   CT Head Wo Contrast Result Date: 04/02/2024 CLINICAL DATA:  Ataxia, head trauma, fall. History of cerebral palsy and developmental delay. EXAM: CT HEAD WITHOUT CONTRAST CT MAXILLOFACIAL WITHOUT CONTRAST CT CERVICAL SPINE WITHOUT CONTRAST TECHNIQUE: Multidetector CT imaging of the head, cervical spine, and maxillofacial structures were performed using the standard protocol without intravenous contrast. Multiplanar CT image reconstructions of the cervical spine and maxillofacial structures were also generated. RADIATION DOSE REDUCTION: This exam was performed according to the departmental dose-optimization program which includes automated exposure control, adjustment of the mA and/or kV according to patient size and/or use of iterative reconstruction technique. COMPARISON:  CT head and cervical spine 02/16/2024. FINDINGS: CT HEAD FINDINGS Brain: Examination is limited due to motion artifact and patient positioning. No acute  intracranial hemorrhage. No evidence of acute large territory infarct. Redemonstrated colpocephaly with prominence of the posterior bodies and occipital horns of lateral ventricles. 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 The basilar cisterns are patent. No extra-axial fluid collections. No midline shift. Slightly asymmetric volume loss in the anterior left frontal lobe. Vascular: No hyperdense vessel or unexpected calcification. Skull: No acute or aggressive finding. Orbits: Orbits are symmetric. Sinuses: Focal mucosal thickening in the posterior ethmoid air cells on the right. Other: Mastoid air cells are clear. CT MAXILLOFACIAL FINDINGS Osseous: The visualized facial bones are without obvious displaced fracture. There is particular limitation of the bones of the orbits due to motion artifact. No suspicious osseous lesion identified. Orbits: Limited visualization of the orbits. The globes appear normal in volume. There is limited visualization of the lenses. The extraocular muscles and optic nerve sheaths are not well evaluated. No findings of retrobulbar hematoma. Sinuses: Focal mucosal thickening in the medial aspect of the right maxillary sinus. Additional mucosal thickening in the right posterior ethmoid air cells. Soft tissues: Visualized soft tissues are unremarkable. CT CERVICAL SPINE FINDINGS Alignment: Cervical lordosis is maintained. Trace anterolisthesis of C7 on T1 noted. There is no definite facet malalignment. Skull base and vertebrae: Within the limitations of this examination there is no definite compression fracture and no definite displaced fracture appreciated. No suspicious osseous lesion. Soft tissues and spinal canal: No definite abnormality  of the visualized soft tissues. Disc levels: Disc space narrowing appears greatest at C5-6 and C6-7. There are likely disc osteophyte complexes at the C5-6 and C6-7 levels. No evidence of high-grade osseous spinal canal stenosis. Upper chest:  Visualized lung apices are unremarkable. Other: None. IMPRESSION: Examination is significantly limited by motion artifact and patient positioning. Within these limitations there is no CT evidence of acute intracranial abnormality. No definite displaced maxillofacial fracture. No definite compression fracture, displaced fracture, or traumatic malalignment in the visualized cervical spine. Electronically Signed   By: Denny Flack M.D.   On: 04/02/2024 18:25   CT Maxillofacial Wo Contrast Result Date: 04/02/2024 CLINICAL DATA:  Ataxia, head trauma, fall. History of cerebral palsy and developmental delay. EXAM: CT HEAD WITHOUT CONTRAST CT MAXILLOFACIAL WITHOUT CONTRAST CT CERVICAL SPINE WITHOUT CONTRAST TECHNIQUE: Multidetector CT imaging of the head, cervical spine, and maxillofacial structures were performed using the standard protocol without intravenous contrast. Multiplanar CT image reconstructions of the cervical spine and maxillofacial structures were also generated. RADIATION DOSE REDUCTION: This exam was performed according to the departmental dose-optimization program which includes automated exposure control, adjustment of the mA and/or kV according to patient size and/or use of iterative reconstruction technique. COMPARISON:  CT head and cervical spine 02/16/2024. FINDINGS: CT HEAD FINDINGS Brain: Examination is limited due to motion artifact and patient positioning. No acute intracranial hemorrhage. No evidence of acute large territory infarct. Redemonstrated colpocephaly with prominence of the posterior bodies and occipital horns of lateral ventricles. 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 The basilar cisterns are patent. No extra-axial fluid collections. No midline shift. Slightly asymmetric volume loss in the anterior left frontal lobe. Vascular: No hyperdense vessel or unexpected calcification. Skull: No acute or aggressive finding. Orbits: Orbits are symmetric. Sinuses: Focal mucosal  thickening in the posterior ethmoid air cells on the right. Other: Mastoid air cells are clear. CT MAXILLOFACIAL FINDINGS Osseous: The visualized facial bones are without obvious displaced fracture. There is particular limitation of the bones of the orbits due to motion artifact. No suspicious osseous lesion identified. Orbits: Limited visualization of the orbits. The globes appear normal in volume. There is limited visualization of the lenses. The extraocular muscles and optic nerve sheaths are not well evaluated. No findings of retrobulbar hematoma. Sinuses: Focal mucosal thickening in the medial aspect of the right maxillary sinus. Additional mucosal thickening in the right posterior ethmoid air cells. Soft tissues: Visualized soft tissues are unremarkable. CT CERVICAL SPINE FINDINGS Alignment: Cervical lordosis is maintained. Trace anterolisthesis of C7 on T1 noted. There is no definite facet malalignment. Skull base and vertebrae: Within the limitations of this examination there is no definite compression fracture and no definite displaced fracture appreciated. No suspicious osseous lesion. Soft tissues and spinal canal: No definite abnormality of the visualized soft tissues. Disc levels: Disc space narrowing appears greatest at C5-6 and C6-7. There are likely disc osteophyte complexes at the C5-6 and C6-7 levels. No evidence of high-grade osseous spinal canal stenosis. Upper chest: Visualized lung apices are unremarkable. Other: None. IMPRESSION: Examination is significantly limited by motion artifact and patient positioning. Within these limitations there is no CT evidence of acute intracranial abnormality. No definite displaced maxillofacial fracture. No definite compression fracture, displaced fracture, or traumatic malalignment in the visualized cervical spine. Electronically Signed   By: Denny Flack M.D.   On: 04/02/2024 18:25   CT Cervical Spine Wo Contrast Result Date: 04/02/2024 CLINICAL DATA:   Ataxia, head trauma, fall. History of cerebral palsy and  developmental delay. EXAM: CT HEAD WITHOUT CONTRAST CT MAXILLOFACIAL WITHOUT CONTRAST CT CERVICAL SPINE WITHOUT CONTRAST TECHNIQUE: Multidetector CT imaging of the head, cervical spine, and maxillofacial structures were performed using the standard protocol without intravenous contrast. Multiplanar CT image reconstructions of the cervical spine and maxillofacial structures were also generated. RADIATION DOSE REDUCTION: This exam was performed according to the departmental dose-optimization program which includes automated exposure control, adjustment of the mA and/or kV according to patient size and/or use of iterative reconstruction technique. COMPARISON:  CT head and cervical spine 02/16/2024. FINDINGS: CT HEAD FINDINGS Brain: Examination is limited due to motion artifact and patient positioning. No acute intracranial hemorrhage. No evidence of acute large territory infarct. Redemonstrated colpocephaly with prominence of the posterior bodies and occipital horns of lateral ventricles. 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 The basilar cisterns are patent. No extra-axial fluid collections. No midline shift. Slightly asymmetric volume loss in the anterior left frontal lobe. Vascular: No hyperdense vessel or unexpected calcification. Skull: No acute or aggressive finding. Orbits: Orbits are symmetric. Sinuses: Focal mucosal thickening in the posterior ethmoid air cells on the right. Other: Mastoid air cells are clear. CT MAXILLOFACIAL FINDINGS Osseous: The visualized facial bones are without obvious displaced fracture. There is particular limitation of the bones of the orbits due to motion artifact. No suspicious osseous lesion identified. Orbits: Limited visualization of the orbits. The globes appear normal in volume. There is limited visualization of the lenses. The extraocular muscles and optic nerve sheaths are not well evaluated. No findings of  retrobulbar hematoma. Sinuses: Focal mucosal thickening in the medial aspect of the right maxillary sinus. Additional mucosal thickening in the right posterior ethmoid air cells. Soft tissues: Visualized soft tissues are unremarkable. CT CERVICAL SPINE FINDINGS Alignment: Cervical lordosis is maintained. Trace anterolisthesis of C7 on T1 noted. There is no definite facet malalignment. Skull base and vertebrae: Within the limitations of this examination there is no definite compression fracture and no definite displaced fracture appreciated. No suspicious osseous lesion. Soft tissues and spinal canal: No definite abnormality of the visualized soft tissues. Disc levels: Disc space narrowing appears greatest at C5-6 and C6-7. There are likely disc osteophyte complexes at the C5-6 and C6-7 levels. No evidence of high-grade osseous spinal canal stenosis. Upper chest: Visualized lung apices are unremarkable. Other: None. IMPRESSION: Examination is significantly limited by motion artifact and patient positioning. Within these limitations there is no CT evidence of acute intracranial abnormality. No definite displaced maxillofacial fracture. No definite compression fracture, displaced fracture, or traumatic malalignment in the visualized cervical spine. Electronically Signed   By: Denny Flack M.D.   On: 04/02/2024 18:25    Reassessment and Plan:   Patient given ER for 3 and half hours.  No seizure-like episodes.  Family at bedside states that it seems to have all resolved at this time.  CT head with no focal pathology.  Patient has been tolerating p.o. intake.  Caregiver at bedside feels comfortable with outpatient care management follow-up with PCP/neurology.   Disposition:  I have considered need for hospitalization, however, considering all of the above, I believe this patient is stable for discharge at this time.  Patient/family educated about specific return precautions for given chief complaint and symptoms.   Patient/family educated about follow-up with PCP.     Patient/family expressed understanding of return precautions and need for follow-up. Patient spoken to regarding all imaging and laboratory results and appropriate follow up for these results. All education provided in verbal form  with additional information in written form. Time was allowed for answering of patient questions. Patient discharged.    Emergency Department Medication Summary:   Medications - No data to display       Clinical Impression:  1. Seizure-like activity [facility])      Discharge   Final Clinical Impression(s) / ED Diagnoses Final diagnoses:  Seizure-like activity [facility])    Rx / DC Orders ED Discharge Orders     None         Onetha Bile, MD 04/17/24 2011

## 2024-04-18 ENCOUNTER — Telehealth: Payer: Self-pay | Admitting: *Deleted

## 2024-04-18 NOTE — Telephone Encounter (Signed)
 Crystal, RN at [facility] called regarding discharge instructions as it relates to bedrest.  RNCM reviewed chart and read EDP note, which does not mention bedrest and it is also not located on AVS.  No additional RNCM needs identified.  Kaytlynne Neace J. Rachel Budds, RN, BSN, NCM  Transitions of Care  Nurse Case Manager  [facility] Emergency Departments  Operative Services  [phone]

## 2024-06-06 ENCOUNTER — Emergency Department (HOSPITAL_COMMUNITY)
Admission: EM | Admit: 2024-06-06 | Discharge: 2024-06-06 | Disposition: A | Discharge status: Home / Self Care | Attending: Emergency Medicine | Admitting: Emergency Medicine

## 2024-06-06 ENCOUNTER — Emergency Department (HOSPITAL_COMMUNITY)

## 2024-06-06 DIAGNOSIS — S0990XA Unspecified injury of head, initial encounter: Secondary | ICD-10-CM

## 2024-06-06 DIAGNOSIS — S0181XA Laceration without foreign body of other part of head, initial encounter: Secondary | ICD-10-CM | POA: Insufficient documentation

## 2024-06-06 DIAGNOSIS — T148XXA Other injury of unspecified body region, initial encounter: Secondary | ICD-10-CM

## 2024-06-06 DIAGNOSIS — W050XXA Fall from non-moving wheelchair, initial encounter: Secondary | ICD-10-CM | POA: Diagnosis not present

## 2024-06-06 DIAGNOSIS — I10 Essential (primary) hypertension: Secondary | ICD-10-CM | POA: Insufficient documentation

## 2024-06-06 DIAGNOSIS — W19XXXA Unspecified fall, initial encounter: Secondary | ICD-10-CM

## 2024-06-06 DIAGNOSIS — Z79899 Other long term (current) drug therapy: Secondary | ICD-10-CM | POA: Insufficient documentation

## 2024-06-06 MED ORDER — BACITRACIN ZINC 500 UNIT/GM EX OINT
TOPICAL_OINTMENT | CUTANEOUS | Status: AC
  Filled 2024-06-06: qty 0.9

## 2024-06-06 NOTE — ED Triage Notes (Signed)
 Per EMS from [facility]. Witnessed fall from wheelchair. No LOC. Laceration to forehead. Not on blood thinners.  HR 100 99 on RA

## 2024-06-06 NOTE — Discharge Instructions (Addendum)
 Your history, exam, workup today are consistent with superficial soft tissues of the laceration and abrasion to your forehead after the fall but the CT scan was reassuring with no evidence of skull fracture or intracranial bleeding.  Given your reassuring exam otherwise, and reassuring imaging, we feel you are safe for discharge home.  Please follow-up with your primary doctor and rest.  Please watch for signs and symptoms of infection although the wound was well cleaned and your tetanus shot is up-to-date.  If any symptoms change or worsen acutely, return to the nearest Emergency Department.  People careful not to fall in the future.

## 2024-06-06 NOTE — ED Provider Notes (Signed)
 [facility] AT [facility] Provider Note   CSN: [id number] Arrival date & time: 06/06/24  4098     Patient presents with: No chief complaint on file.   Howard Best is a 73 y.o. male.   The history is provided by the patient and medical records. No language interpreter was used.  Fall This is a new problem. The current episode started less than 1 hour ago. The problem occurs rarely. The problem has not changed since onset.Pertinent negatives include no chest pain, no abdominal pain, no headaches and no shortness of breath. Nothing aggravates the symptoms. Nothing relieves the symptoms. He has tried nothing for the symptoms. The treatment provided no relief.       Prior to Admission medications   Medication Sig Start Date End Date Taking? Authorizing Provider  acetaminophen  (TYLENOL ) 160 MG/5ML solution Take 20 mLs by mouth every 6 (six) hours as needed.    [provider]  acetaminophen  (TYLENOL ) 325 MG tablet Take 650 mg by mouth every 4 (four) hours as needed.    [provider]  alum & mag hydroxide-simeth (MAALOX/MYLANTA) 200-200-20 MG/5ML suspension Take 15 mLs by mouth as needed for indigestion or heartburn.    [provider]  bisacodyl (FLEET) 10 MG/30ML ENEM Place 10 mg rectally as needed.    [provider]  Brompheniramine-Pseudoeph (DIMETAPP PO) Take 20 mLs by mouth as needed. Patient not taking: Reported on 08/02/2023    [provider]  Carbamide Peroxide-Saline (CLEARCANAL EAR WAX REMOVAL) 6.5 % KIT Place in ear(s) as needed.    [provider]  chlorhexidine  (PERIDEX ) 0.12 % solution Use as directed 5 mLs in the mouth or throat at bedtime.    [provider]  Cholecalciferol (VITAMIN D) 50 MCG (2000 UT) CAPS Take 2,000 Units by mouth daily.    [provider]  diazePAM (VALTOCO 5 MG DOSE) 5 MG/0.1ML LIQD Place 1 spray into the nose daily as needed  (seizure). Patient not taking: Reported on 08/02/2023    [provider]  diazePAM, 15 MG Dose, (VALTOCO 15 MG DOSE) 2 x 7.5 MG/0.1ML LQPK Place 7.5 mg into the nose as directed. As needed for seizure greater than or equal to 3 minutes. After 4 hours if seizure recurs, repeat dose. Max 1 episode eery 5 days. Alternate nostrils with each spray.    [provider]  Diethyltoluamide (OFF ACTIVE) 15 % AERO Apply topically as needed. Patient not taking: Reported on 08/02/2023    [provider]  diphenhydrAMINE (BENADRYL ALLERGY) 25 MG tablet Take 25 mg by mouth every 6 (six) hours as needed. Patient not taking: Reported on 08/02/2023    [provider]  diphenhydrAMINE (BENADRYL) 12.5 MG/5ML liquid Take 10 mLs by mouth every 6 (six) hours as needed.    [provider]  docusate sodium (COLACE) 100 MG capsule Take 100 mg by mouth daily.    [provider]  DULoxetine  (CYMBALTA ) 30 MG capsule Take 30 mg by mouth daily.    [provider]  guaiFENesin (ROBITUSSIN) 100 MG/5ML liquid Take 5 mLs by mouth every 4 (four) hours as needed for cough or to loosen phlegm.    [provider]  hydrocortisone cream 1 % Apply 1 Application topically as needed for itching.    [provider]  ibuprofen (ADVIL) 200 MG tablet Take 200-600 mg by mouth as needed. Patient not taking: Reported on 08/02/2023    [provider]  ketoconazole (  NIZORAL) 2 % shampoo Apply 1 application. topically every Monday, Wednesday, and Friday.    [provider]  Lacosamide  100 MG TABS Take 1.5 tablets (150 mg total) by mouth in the morning and at bedtime. Patient taking differently: Take 200 mg by mouth in the morning and at bedtime. 03/26/23 03/20/24  Mozell Arias, MD  Lactobacillus (ACIDOPHILUS/BIFIDUS PO) Take 2 capsules by mouth daily.    [provider]  levETIRAcetam  (KEPPRA ) 750 MG tablet Take 2 tablets (1,500 mg total) by mouth 2  (two) times daily. 08/01/22 07/27/23  Camara, Amadou, MD  lisinopril  (ZESTRIL ) 10 MG tablet Take 10 mg by mouth every evening.    [provider]  loperamide (IMODIUM A-D) 2 MG tablet Take 4 mg by mouth as needed for diarrhea or loose stools.    [provider]  LORazepam  (ATIVAN ) 1 MG tablet Take 2 mg by mouth as directed. Prior to dental procedures    [provider]  magnesium hydroxide (MILK OF MAGNESIA) 400 MG/5ML suspension Take 30 mLs by mouth as needed for mild constipation.    [provider]  mupirocin ointment (BACTROBAN) 2 % Apply 1 Application topically as needed. Patient not taking: Reported on 08/02/2023    [provider]  neomycin-bacitracin-polymyxin (NEOSPORIN) OINT Apply 1 Application topically as needed for wound care. Patient not taking: Reported on 08/02/2023    [provider]  polyethylene glycol (MIRALAX  / GLYCOLAX ) 17 g packet Take 17 g by mouth every other day.    [provider]  promethazine (PHENERGAN) 12.5 MG tablet Take 12.5 mg by mouth every 12 (twelve) hours as needed for nausea or vomiting.    [provider]  promethazine (PHENERGAN) 25 MG suppository Place 25 mg rectally every 6 (six) hours as needed for nausea or vomiting.    [provider]  Vitamins A & D (VITAMIN A & D) ointment Apply 1 Application topically as needed for dry skin. Patient not taking: Reported on 08/02/2023    [provider]  zinc oxide (BALMEX) 11.3 % CREA cream Apply 1 Application topically as needed. Patient not taking: Reported on 08/02/2023    [provider]    Allergies: Patient has no known allergies.    Review of Systems  Constitutional:  Negative for chills, fatigue and fever.  HENT:  Negative for congestion.   Respiratory:  Negative for cough, chest tightness and shortness of breath.   Cardiovascular:  Negative for chest pain.  Gastrointestinal:  Negative for abdominal pain,  constipation, diarrhea, nausea and vomiting.  Genitourinary:  Negative for flank pain.  Musculoskeletal:  Negative for back pain, neck pain and neck stiffness.  Skin:  Positive for wound. Negative for rash.  Neurological:  Negative for headaches.  Psychiatric/Behavioral:  Negative for agitation and confusion.     Updated Vital Signs There were no vitals taken for this visit.  Physical Exam Vitals and nursing note reviewed.  Constitutional:      General: He is not in acute distress.    Appearance: He is well-developed.  HENT:     Head: Normocephalic. Abrasion, contusion and laceration present.    Eyes:     Extraocular Movements: Extraocular movements intact.     Conjunctiva/sclera: Conjunctivae normal.     Comments: Right pupil larger than left but family ports this is unchanged from baseline.   Cardiovascular:     Rate and Rhythm: Normal rate and regular rhythm.     Heart sounds: No murmur heard. Pulmonary:  Effort: Pulmonary effort is normal. No respiratory distress.     Breath sounds: Normal breath sounds.  Abdominal:     Palpations: Abdomen is soft.     Tenderness: There is no abdominal tenderness.   Musculoskeletal:        General: Signs of injury present. No swelling or tenderness.     Cervical back: Neck supple.   Skin:    General: Skin is warm and dry.     Capillary Refill: Capillary refill takes less than 2 seconds.     Findings: No erythema.   Neurological:     Mental Status: He is alert. Mental status is at baseline.     Comments: Chronic weakness and contractures unchanged from baseline per family.  Psychiatric:        Mood and Affect: Mood normal.     (all labs ordered are listed, but only abnormal results are displayed) Labs Reviewed - No data to display  EKG: None  Radiology: CT Head Wo Contrast Result Date: 06/06/2024 EXAM: CT HEAD WITHOUT CONTRAST 06/06/2024 08:47:00 PM TECHNIQUE: CT of the head was performed without the administration of  intravenous contrast. Automated exposure control, iterative reconstruction, and/or weight based adjustment of the mA/kV was utilized to reduce the radiation dose to as low as reasonably achievable. COMPARISON: 04/17/2024 CLINICAL HISTORY: Head trauma, minor (Age >= 65y); head trauma and lac to forehead. Notes from triage: Per EMS from [facility] Witnessed fall from wheelchair. No LOC. Laceration to forehead. Not on blood thinners. FINDINGS: BRAIN AND VENTRICLES: There is no acute intracranial hemorrhage, mass effect or midline shift. No abnormal extra-axial fluid collection. The gray-white differentiation is maintained without an acute infarct. 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 ORBITS: The visualized portion of the orbits demonstrate no acute abnormality. SINUSES: The visualized paranasal sinuses and mastoid air cells demonstrate no acute abnormality. SOFT TISSUES AND SKULL: Soft tissue swelling and laceration overlying the right frontal bone. No acute abnormality of the visualized skull. IMPRESSION: 1. Soft tissue swelling/laceration overlying the right frontal bone. 2. No acute intracranial abnormality. 3. Stable colpocephaly, as above. Electronically signed by: Zadie Herter MD 06/06/2024 08:52 PM EDT RP Workstation: [id number]     Procedures   Medications Ordered in the ED  bacitracin 500 UNIT/GM ointment (has no administration in time range)                                    Medical Decision Making Amount and/or Complexity of Data Reviewed Radiology: ordered.    Howard Best is a 73 y.o. male with a past medical history sniffing for cerebral palsy, intellectual disability, epilepsy, hypertension, and depression who presents with a fall.  According to family, patient tried to get up from his wheelchair and fell and hit his head.  He did not lose consciousness and has injury to his forehead.  Otherwise he has  no complaints.  He does not want to be here.  He is denying injuries extremities.  Family and patient both say that he has no preceding symptoms with no recent fevers, chills, congestion, cough, nausea, vomiting, constipation, diarrhea, or urinary changes.  They think his tetanus shot is up-to-date.  They denied other complaints.  On exam, he has abrasions laceration to his forehead right at his hairline.  Otherwise family reports he is at his mental status baseline and he had no focal neurologic  changes from his baseline where he has chronic weakness and contractures in extremities.  Pupils are at his baseline with right side larger than left but they were reactive.  No other focal evidence of acute trauma.  We had a shared decision conversation and agreed to get a CT of the head and if this is reassuring, will provide wound care and anticipate discharge home we will hold on extensive workup otherwise.  Patient and family agree.  Anticipate reassessment after CT scan.      CT scan reassuring and with no evidence of acute fracture or intracranial bleeding.  We cleaned and examine the wound and it would not separate and was superficial.  We agreed with family to cover with bacitracin and bandage it but does not appear to need wound care otherwise with sutures or glue.  They agree.  Wound was cleaned and dressed and he will be discharged for outpatient follow-up.  Family is agrees and patient was discharged.      Final diagnoses:  Fall, initial encounter  Injury of head, initial encounter  Abrasion  Laceration of forehead, initial encounter    ED Discharge Orders     None       Clinical Impression: 1. Fall, initial encounter   2. Injury of head, initial encounter   3. Abrasion   4. Laceration of forehead, initial encounter     Disposition: Discharge  Condition: Good  I have discussed the results, Dx and Tx plan with the pt(& family if present). He/she/they expressed  understanding and agree(s) with the plan. Discharge instructions discussed at great length. Strict return precautions discussed and pt &/or family have verbalized understanding of the instructions. No further questions at time of discharge.    New Prescriptions   No medications on file    Follow Up: Eduardo Grade [address] [place] [address] [phone]         Nichole Neyer, Marine Sia, MD 06/06/24 2144

## 2024-07-10 ENCOUNTER — Ambulatory Visit: Admitting: Podiatry

## 2024-07-22 ENCOUNTER — Ambulatory Visit (INDEPENDENT_AMBULATORY_CARE_PROVIDER_SITE_OTHER): Admitting: Podiatry

## 2024-07-22 ENCOUNTER — Encounter: Payer: Self-pay | Admitting: Podiatry

## 2024-07-22 VITALS — Ht 60.0 in | Wt 130.0 lb

## 2024-07-22 DIAGNOSIS — M79675 Pain in left toe(s): Secondary | ICD-10-CM

## 2024-07-22 DIAGNOSIS — B351 Tinea unguium: Secondary | ICD-10-CM

## 2024-07-22 DIAGNOSIS — M79674 Pain in right toe(s): Secondary | ICD-10-CM

## 2024-07-22 NOTE — Progress Notes (Signed)
   SUBJECTIVE Patient nonambulatory PMHx cerebral palsy presents to office today with his transport coming from a group home complaining of elongated, thickened nails that cause pain while ambulating in shoes.  Patient is unable to trim their own nails. Patient is here for further evaluation and treatment.  Past Medical History:  Diagnosis Date   Cerebral palsy (HCC)    Epilepsy (HCC)    Intellectual disability    Major depressive disorder, single episode, unspecified     OBJECTIVE General Patient is awake, alert, and oriented x 3 and in no acute distress. Derm Skin is dry and supple bilateral. Negative open lesions or macerations. Remaining integument unremarkable. Nails are tender, long, thickened and dystrophic with subungual debris, consistent with onychomycosis, 1-5 bilateral. No signs of infection noted. Vasc  DP and PT pedal pulses palpable bilaterally. Temperature gradient within normal limits.  Neuro light touch and protective threshold diminished Musculoskeletal Exam nonambulatory in a wheelchair  ASSESSMENT 1.  Pain due to onychomycosis of toenails both  PLAN OF CARE 1. Patient evaluated today.  2. Instructed to maintain good pedal hygiene and foot care.  3. Mechanical debridement of nails 1-5 bilaterally performed using a nail nipper. Filed with dremel without incident.  4. Return to clinic in 3 mos.    Thresa EMERSON Sar, DPM [facility]  Dr. Thresa EMERSON Sar, DPM    [address] [place], [place] [address]                Office [phone]  Fax [phone]

## 2024-07-31 NOTE — Progress Notes (Signed)
 [facility]  PATIENT: Howard Best DOB: 07-12-1951  REQUESTING CLINICIAN: Royals, Best HERO, MD HISTORY FROM: Chart review and Caregiver  REASON FOR VISIT: Seizure disorder    HISTORICAL  CHIEF COMPLAINT:  Chief Complaint  Patient presents with   Follow-up    Pt in room 8. CNA in room.Here for seizure follow up. Patient reports no recent seizures.     HPI:  Update 08/01/2024 JM: Patient returns for 1 year seizure follow-up accompanied by group home caregiver.  Previously seen by Dr. Gregg 1 year ago and noted 1-2 seizures per month with recent adjustment to lacosamide  dosage to 200 mg twice daily in addition to levetiracetam  1500 mg twice daily.  ED eval 10/2023 for recurrent seizure  ED eval 03/2024 with group home reporting multiple seizures described as full body shakes and repeat seizures  look like he is bearing down for 10 seconds.  Group home noted 11 seizures in about a 5 hr period. No additional seizures seen while in ED. CT head no acute abnormality.  Lab work benign.  No medication adjustments recommended.  Caregiver provides history. No recurrent seizure activity since that time.  Remains compliant on levetiracetam  and lacosamide , no noticeable side effects.  He is looking forward to his 73st birthday next week. No questions or concerns at this time.      History provided from Dr. Janean prior OV note purposes only INTERVAL HISTORY 08/02/2023 Howard Best presents today for follow-up, he is accompanied by caregiver.  Last visit was a year ago since then he has been doing well.  Caregiver report about 1-2 seizures per month.  These are his typical seizures where he will have a grunting noise and look like difficulty breathing.  They will find him on or put cold water  on him which usually help with the seizures.  He is compliant with his medication, Keppra  1500 mg twice daily and Vimpat  200 mg twice daily.  His Vimpat  was recently increase when he  had a cluster of seizure in April requiring intranasal rescue medication and trip to the hospital. His last seizure was this morning.  They denies any generalized convulsion, no injuries and no recent falls.   HISTORY OF PRESENT ILLNESS:  Mr. Howard Best is a 73 year old gentleman with past medical history of seizure disorder, cerebral palsy, wheelchair dependent, intellectual disability who was brought in to the clinic by caregiver for his diagnosis of seizures. Patient was admitted back in April for seizure cluster.  Per chart review there is report of patient having 1-2 seizures per month versus 1-2 seizures per year.  He was on gabapentin and Topamax but on the day of admission, he presented with a cluster of 6 seizures.  In the hospital his medication were switched to Keppra  and Vimpat , his EEG did not show any acute seizures and since then he has been doing well.  Per caregiver, no additional seizures since being discharged, he is tolerating the medication very well.  Caregiver has been working with the patient for the past 4 months and has not witnessed any seizures. No other complaints    Hospital Summary  Mr. Howard Best is a 72 y.o. M with cerebral palsy, wheelchair dependent, congenital cognitive impairment and hx seizures who presented with seizures. Evidently, patient has occasional breakthrough seizures (1-2 per month by one report, 1-2 per year by another).  On the day of admission, patient had a cluster of 6 seizures and then was sleepy and unresponsive so EMS were called. In  the hospital, neuraxial imaging unremarkable.  EEG normal.  No evidence of infection or electrolyte abnormality other than mild non-gap metabolic acidosis and mild hypokalemia (in the setting of stopping Topamax) which corrected. His gabapentin and Topamax were stopped and he was transitioned to Keppra and Vimpat.  He should have close Neurology follow up.   OTHER MEDICAL CONDITIONS: Cerebral Palsy, Seizure disorder,  intellectual disability  REVIEW OF SYSTEMS: Full 14 system review of systems performed and negative with exception of:  Unable to fully obtain due to intellectual disability   ALLERGIES: No Known Allergies  HOME MEDICATIONS: Outpatient Medications Prior to Visit  Medication Sig Dispense Refill   Lactobacillus (ACIDOPHILUS/BIFIDUS PO) Take 2 capsules by mouth daily.     acetaminophen (TYLENOL) 160 MG/5ML solution Take 20 mLs by mouth every 6 (six) hours as needed.     acetaminophen (TYLENOL) 325 MG tablet Take 650 mg by mouth every 4 (four) hours as needed.     alum & mag hydroxide-simeth (MAALOX/MYLANTA) 200-200-20 MG/5ML suspension Take 15 mLs by mouth as needed for indigestion or heartburn.     bisacodyl (FLEET) 10 MG/30ML ENEM Place 10 mg rectally as needed.     Brompheniramine-Pseudoeph (DIMETAPP PO) Take 20 mLs by mouth as needed.     Carbamide Peroxide-Saline (CLEARCANAL EAR WAX REMOVAL) 6.5 % KIT Place in ear(s) as needed.     chlorhexidine (PERIDEX) 0.12 % solution Use as directed 5 mLs in the mouth or throat at bedtime.     Cholecalciferol (VITAMIN D) 50 MCG (2000 UT) CAPS Take 2,000 Units by mouth daily.     diazePAM (VALTOCO 5 MG DOSE) 5 MG/0.1ML LIQD Place 1 spray into the nose daily as needed (seizure).     diazePAM, 15 MG Dose, (VALTOCO 15 MG DOSE) 2 x 7.5 MG/0.1ML LQPK Place 7.5 mg into the nose as directed. As needed for seizure greater than or equal to 3 minutes. After 4 hours if seizure recurs, repeat dose. Max 1 episode eery 5 days. Alternate nostrils with each spray.     Diethyltoluamide (OFF ACTIVE) 15 % AERO Apply topically as needed.     diphenhydrAMINE (BENADRYL ALLERGY) 25 MG tablet Take 25 mg by mouth every 6 (six) hours as needed.     diphenhydrAMINE (BENADRYL) 12.5 MG/5ML liquid Take 10 mLs by mouth every 6 (six) hours as needed.     docusate sodium (COLACE) 100 MG capsule Take 100 mg by mouth daily.     DULoxetine (CYMBALTA) 30 MG capsule Take 30 mg by mouth  daily.     guaiFENesin (ROBITUSSIN) 100 MG/5ML liquid Take 5 mLs by mouth every 4 (four) hours as needed for cough or to loosen phlegm.     hydrocortisone cream 1 % Apply 1 Application topically as needed for itching.     ibuprofen (ADVIL) 200 MG tablet Take 200-600 mg by mouth as needed.     ketoconazole (NIZORAL) 2 % shampoo Apply 1 application. topically every Monday, Wednesday, and Friday.     Lacosamide 100 MG TABS Take 1.5 tablets (150 mg total) by mouth in the morning and at bedtime. (Patient taking differently: Take 200 mg by mouth in the morning and at bedtime.) 100 tablet 3   levETIRAcetam (KEPPRA) 750 MG tablet Take 2 tablets (1,500 mg total) by mouth 2 (two) times daily. 360 tablet 3   lisinopril (ZESTRIL) 10 MG tablet Take 10 mg by mouth every evening.     loperamide (IMODIUM A-D) 2 MG tablet Take 4 mg by  mouth as needed for diarrhea or loose stools.     LORazepam  (ATIVAN ) 1 MG tablet Take 2 mg by mouth as directed. Prior to dental procedures     magnesium hydroxide (MILK OF MAGNESIA) 400 MG/5ML suspension Take 30 mLs by mouth as needed for mild constipation.     mupirocin ointment (BACTROBAN) 2 % Apply 1 Application topically as needed.     neomycin-bacitracin -polymyxin (NEOSPORIN) OINT Apply 1 Application topically as needed for wound care.     polyethylene glycol (MIRALAX  / GLYCOLAX ) 17 g packet Take 17 g by mouth every other day.     promethazine (PHENERGAN) 12.5 MG tablet Take 12.5 mg by mouth every 12 (twelve) hours as needed for nausea or vomiting.     promethazine (PHENERGAN) 25 MG suppository Place 25 mg rectally every 6 (six) hours as needed for nausea or vomiting.     Vitamins A & D (VITAMIN A & D) ointment Apply 1 Application topically as needed for dry skin.     zinc  oxide (BALMEX) 11.3 % CREA cream Apply 1 Application topically as needed.     No facility-administered medications prior to visit.    PAST MEDICAL HISTORY: Past Medical History:  Diagnosis Date    Cerebral palsy (HCC)    Epilepsy (HCC)    Intellectual disability    Major depressive disorder, single episode, unspecified     PAST SURGICAL HISTORY: History reviewed. No pertinent surgical history.  FAMILY HISTORY: History reviewed. No pertinent family history.  SOCIAL HISTORY: Social History   Socioeconomic History   Marital status: Single    Spouse name: Not on file   Number of children: Not on file   Years of education: Not on file   Highest education level: Not on file  Occupational History   Not on file  Tobacco Use   Smoking status: Never   Smokeless tobacco: Never  Substance and Sexual Activity   Alcohol use: Not Currently   Drug use: Not Currently   Sexual activity: Not Currently  Other Topics Concern   Not on file  Social History Narrative   Not on file   Social Drivers of Health   Financial Resource Strain: Not on file  Food Insecurity: Not on file  Transportation Needs: Not on file  Physical Activity: Not on file  Stress: Not on file  Social Connections: Unknown (03/22/2023)   Received from [facility]   Social Network    Social Network: Not on file  Intimate Partner Violence: Unknown (03/22/2023)   Received from [facility]   HITS    Physically Hurt: Not on file    Insult or Talk Down To: Not on file    Threaten Physical Harm: Not on file    Scream or Curse: Not on file     PHYSICAL EXAM  GENERAL EXAM/CONSTITUTIONAL: Vitals:  Vitals:   08/01/24 1123 08/01/24 1128 08/01/24 1134  BP: (!) 149/90 (!) 142/90 138/88  Pulse: 63    Weight: 154 lb (69.9 kg)      Body mass index is 30.08 kg/m. Wt Readings from Last 3 Encounters:  08/01/24 154 lb (69.9 kg)  07/22/24 130 lb (59 kg)  04/17/24 130 lb (59 kg)   Patient is in no distress; well developed, nourished and groomed; neck is supple Able to follow simple commands  Upper extremities at least antigravity; contractures in upper and lower extremities Able to track examiner Wheelchair  dependent     DIAGNOSTIC DATA (LABS, IMAGING, TESTING) - I reviewed  patient records, labs, notes, testing and imaging myself where available.  Lab Results  Component Value Date   WBC 8.7 04/17/2024   HGB 15.4 04/17/2024   HCT 46.0 04/17/2024   MCV 96.4 04/17/2024   PLT 235 04/17/2024      Component Value Date/Time   NA 138 04/17/2024 1650   K 4.6 04/17/2024 1650   CL 103 04/17/2024 1650   CO2 27 04/17/2024 1650   GLUCOSE 95 04/17/2024 1650   BUN 14 04/17/2024 1650   CREATININE 0.82 04/17/2024 1650   CALCIUM 9.0 04/17/2024 1650   PROT 6.7 04/17/2024 1650   ALBUMIN 3.9 04/17/2024 1650   AST 23 04/17/2024 1650   ALT 19 04/17/2024 1650   ALKPHOS 57 04/17/2024 1650   BILITOT 0.7 04/17/2024 1650   GFRNONAA >60 04/17/2024 1650   GFRAA >60 09/10/2020 1715   No results found for: CHOL, HDL, LDLCALC, LDLDIRECT, TRIG No results found for: HGBA1C No results found for: VITAMINB12+ No results found for: TSH  LTM EEG 04/17/2022 ABNORMALITY - Continuous slow, generalized   IMPRESSION: This study is suggestive of moderate diffuse encephalopathy, nonspecific etiology. No seizures or epileptiform discharges were seen throughout the recording.  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 No acute abnormality is noted.      ASSESSMENT AND PLAN  73 y.o. year old male  with with history of intellectual disability, cerebral palsy, seizure disorder who is presenting for follow up.  He continued to have intractable seizures, last ED visit was in April and remained on lacosamide  200 mg twice daily and levetiracetam  1500 mg twice daily.  No recurrent seizure since that time. Will keep at same dosages for now as he has been seizure free over the past 4 months. Advised group home to contact office with any additional seizure activity. Refill provided for Keppra , Vimpat  recently filled 8/1, will provide  refill when needed.     Follow up in 1 year or call earlier if needed   I personally spent a total of 25 minutes in the care of the patient today including preparing to see the patient, getting/reviewing separately obtained history, performing a medically appropriate exam/evaluation, counseling and educating, placing orders, and documenting clinical information in the EHR. This is our first time meeting and time has been spent reviewing past medical history and relevant medical records.   Harlene Bogaert, AGNP-BC  [facility] [address] [place], [place] [address]  Phone [phone] Fax [phone] Note: This document was prepared with digital dictation and possible smart phrase technology. Any transcriptional errors that result from this process are unintentional.

## 2024-08-01 ENCOUNTER — Ambulatory Visit (INDEPENDENT_AMBULATORY_CARE_PROVIDER_SITE_OTHER): Payer: Medicare Other | Admitting: Adult Health

## 2024-08-01 ENCOUNTER — Encounter: Payer: Self-pay | Admitting: Adult Health

## 2024-08-01 VITALS — BP 138/88 | HR 63 | Wt 154.0 lb

## 2024-08-01 DIAGNOSIS — G40919 Epilepsy, unspecified, intractable, without status epilepticus: Secondary | ICD-10-CM

## 2024-08-01 MED ORDER — LEVETIRACETAM 750 MG PO TABS: 1500.0000 mg | ORAL_TABLET | Freq: Two times a day (BID) | ORAL | 3 refills | Status: DC

## 2024-08-01 MED ORDER — LEVETIRACETAM 750 MG PO TABS
1500.0000 mg | ORAL_TABLET | Freq: Two times a day (BID) | ORAL | 3 refills | Status: AC
Start: 2024-08-01 — End: 2025-07-27

## 2024-08-01 NOTE — Patient Instructions (Signed)
 Your Plan:  Continue Keppra  1500 mg twice daily and Vimpat  200 mg twice daily  Please notify office with any additional seizure activity     Follow-up in 1 year with Dr. Gregg or call earlier if needed     Thank you for coming to see us  at [facility]. I hope we have been able to provide you high quality care today.  You may receive a patient satisfaction survey over the next few weeks. We would appreciate your feedback and comments so that we may continue to improve ourselves and the health of our patients.

## 2024-09-11 ENCOUNTER — Emergency Department (HOSPITAL_COMMUNITY)

## 2024-09-11 ENCOUNTER — Other Ambulatory Visit: Payer: Self-pay

## 2024-09-11 ENCOUNTER — Emergency Department (HOSPITAL_COMMUNITY)
Admission: EM | Admit: 2024-09-11 | Discharge: 2024-09-11 | Disposition: A | Discharge status: Home / Self Care | Source: Skilled Nursing Facility | Attending: Emergency Medicine | Admitting: Emergency Medicine

## 2024-09-11 DIAGNOSIS — S0101XA Laceration without foreign body of scalp, initial encounter: Secondary | ICD-10-CM | POA: Diagnosis not present

## 2024-09-11 DIAGNOSIS — K449 Diaphragmatic hernia without obstruction or gangrene: Secondary | ICD-10-CM | POA: Diagnosis not present

## 2024-09-11 DIAGNOSIS — S0990XA Unspecified injury of head, initial encounter: Secondary | ICD-10-CM | POA: Diagnosis present

## 2024-09-11 DIAGNOSIS — W1812XA Fall from or off toilet with subsequent striking against object, initial encounter: Secondary | ICD-10-CM | POA: Insufficient documentation

## 2024-09-11 DIAGNOSIS — W19XXXA Unspecified fall, initial encounter: Secondary | ICD-10-CM

## 2024-09-11 NOTE — ED Provider Notes (Signed)
 [place] [facility] AT [facility] Provider Note   CSN: [id number] Arrival date & time: 09/11/24  1715     Patient presents with: Howard Best is a 73 y.o. male.   Patient has a past medical history of seizures, cerebral palsy who was brought in by ambulance with a chief complaint of fall.  Caregiver is present from the facility who said that patient had already had the fall before he came in.  Caregiver was told by the facility that patient fell from the toilet seat and hit his head.  He did not lose his consciousness. Did not seem like he had a seizure. This was the caregiver's first time taking care of the patient since end of June/beginning of July.  Facility had told the caregiver that patient was around his baseline and he was not acting different.  No changes in his appetite or behavior.  Patient would not talk to me however responded to the caregiver.  Patient started saying that he was mad at the previous caregiver that was taking care of him and he started getting agitated during my evaluation. Per caregiver, patient did not have any fevers, chills recently.  Patient does not walk, he uses wheelchair.    Fall       Prior to Admission medications   Medication Sig Start Date End Date Taking? Authorizing Provider  acetaminophen  (TYLENOL ) 160 MG/5ML solution Take 20 mLs by mouth every 6 (six) hours as needed. Patient not taking: Reported on 08/01/2024    [provider]  acetaminophen  (TYLENOL ) 325 MG tablet Take 650 mg by mouth every 4 (four) hours as needed.    [provider]  alum & mag hydroxide-simeth (MAALOX/MYLANTA) 200-200-20 MG/5ML suspension Take 15 mLs by mouth as needed for indigestion or heartburn.    [provider]  bisacodyl (FLEET) 10 MG/30ML ENEM Place 10 mg rectally as needed.    [provider]  Brompheniramine-Pseudoeph (DIMETAPP PO) Take 20 mLs by mouth as needed.    [provider]  Carbamide Peroxide-Saline (CLEARCANAL EAR WAX REMOVAL) 6.5 % KIT Place in ear(s) as needed.    [provider]  chlorhexidine  (PERIDEX ) 0.12 % solution Use as directed 5 mLs in the mouth or throat at bedtime.    [provider]  Cholecalciferol (VITAMIN D) 50 MCG (2000 UT) CAPS Take 2,000 Units by mouth daily.    [provider]  diazePAM (VALTOCO 5 MG DOSE) 5 MG/0.1ML LIQD Place 1 spray into the nose daily as needed (seizure).    [provider]  diazePAM, 15 MG Dose, (VALTOCO 15 MG DOSE) 2 x 7.5 MG/0.1ML LQPK Place 7.5 mg into the nose as directed. As needed for seizure greater than or equal to 3 minutes. After 4 hours if seizure recurs, repeat dose. Max 1 episode eery 5 days. Alternate nostrils with each spray.    [provider]  Diethyltoluamide (OFF ACTIVE) 15 % AERO Apply topically as needed.    [provider]  diphenhydrAMINE (BENADRYL ALLERGY) 25 MG tablet Take 25 mg by mouth every 6 (six) hours as needed.    [provider]  diphenhydrAMINE (BENADRYL) 12.5 MG/5ML liquid Take 10 mLs by mouth every 6 (six) hours as needed.    [provider]  docusate sodium (COLACE) 100 MG capsule Take 100 mg by mouth daily.    [provider]  DULoxetine  (CYMBALTA ) 60 MG capsule Take 60 mg by mouth daily.  [provider]  guaiFENesin (ROBITUSSIN) 100 MG/5ML liquid Take 5 mLs by mouth every 4 (four) hours as needed for cough or to loosen phlegm.    [provider]  hydrocortisone cream 1 % Apply 1 Application topically as needed for itching.    [provider]  hydrOXYzine (ATARAX) 25 MG tablet Take 25 mg by mouth daily.    [provider]  ibuprofen (ADVIL) 200 MG tablet Take 200-600 mg by mouth as needed.    [provider]  ketoconazole (NIZORAL) 2 % shampoo Apply 1 application. topically every Monday, Wednesday, and Friday.    [provider]  lacosamide  (VIMPAT )  200 MG TABS tablet Take 200 mg by mouth 2 (two) times daily.    [provider]  Lactobacillus (ACIDOPHILUS/BIFIDUS PO) Take 2 capsules by mouth daily.    [provider]  levETIRAcetam  (KEPPRA ) 750 MG tablet Take 2 tablets (1,500 mg total) by mouth 2 (two) times daily. 08/01/24 07/27/25  Whitfield Raisin, NP  lisinopril  (ZESTRIL ) 10 MG tablet Take 10 mg by mouth every evening.    [provider]  loperamide (IMODIUM A-D) 2 MG tablet Take 4 mg by mouth as needed for diarrhea or loose stools.    [provider]  LORazepam  (ATIVAN ) 1 MG tablet Take 2 mg by mouth as directed. Prior to dental procedures Patient not taking: Reported on 08/01/2024    [provider]  magnesium hydroxide (MILK OF MAGNESIA) 400 MG/5ML suspension Take 30 mLs by mouth as needed for mild constipation.    [provider]  mupirocin ointment (BACTROBAN) 2 % Apply 1 Application topically as needed. Patient not taking: Reported on 08/01/2024    [provider]  neomycin-bacitracin -polymyxin (NEOSPORIN) OINT Apply 1 Application topically as needed for wound care.    [provider]  polyethylene glycol (MIRALAX  / GLYCOLAX ) 17 g packet Take 17 g by mouth every other day.    [provider]  promethazine (PHENERGAN) 12.5 MG tablet Take 12.5 mg by mouth every 12 (twelve) hours as needed for nausea or vomiting.    [provider]  promethazine (PHENERGAN) 25 MG suppository Place 25 mg rectally every 6 (six) hours as needed for nausea or vomiting.    [provider]  Vitamins A & D (VITAMIN A & D) ointment Apply 1 Application topically as needed for dry skin.    [provider]  zinc  oxide (BALMEX) 11.3 % CREA cream Apply 1 Application topically as needed. Patient not taking: Reported on 08/01/2024    [provider]    Allergies: Patient has no known allergies.    Review of Systems  Reason unable to perform ROS: All  pertinent ROS in HPI and MDM.    Updated Vital Signs BP (!) 106/59 (BP Location: Right Arm)   Pulse 97   Temp 97.6 F (36.4 C) (Oral)   Resp 13   SpO2 93%   Physical Exam Constitutional:      Appearance: He is ill-appearing.  HENT:     Head:      Comments: 1 cm laceration without much gaping surrounded with dried blood.  No profuse bleeding noted in the area.  No bruising noted in the rest of the head Cardiovascular:     Rate and Rhythm: Normal rate and regular rhythm.  Pulmonary:     Effort: Pulmonary effort is normal.     Breath sounds: Normal breath sounds.  Abdominal:     Palpations: Abdomen is soft.  Tenderness: There is no abdominal tenderness. There is no guarding or rebound.  Musculoskeletal:     Cervical back: No tenderness.  Skin:    General: Skin is warm.  Neurological:     Comments: Contractures and mentation unchanged from baseline, per caregiver     (all labs ordered are listed, but only abnormal results are displayed) Labs Reviewed - No data to display  EKG: None  Radiology: No results found.  .Laceration Repair  Date/Time: 09/11/2024 9:47 PM  Performed by: Edgardo Pontiff, DO Authorized by: Dean Clarity, MD   Consent:    Consent obtained:  Verbal   Consent given by:  Healthcare agent Universal protocol:    Procedure explained and questions answered to patient or proxy's satisfaction: yes     Test results available: yes     Imaging studies available: yes     Patient identity confirmed:  Arm band Laceration details:    Location:  Scalp   Length (cm):  1 Skin repair:    Repair method:  Tissue adhesive Repair type:    Repair type:  Simple Post-procedure details:    Dressing:  Open (no dressing) Comments:     Used Dermabond to repair laceration   None  Medications Ordered in the ED - No data to display                                 Medical Decision Making Patient was brought in due to fall and laceration on the head.  Per  caregiver, his mentation was at baseline.  Physical exam showed laceration on the head which did not have much gaping and was closed with Dermabond.  His differential diagnoses include possible TBI, concussion, contusion, possible hematomas.  Patient's CT head and neck showed no acute abnormalities or bleeds.  CXR showed findings that could be due to atelectasis or pneumonia however exam was severely degraded due to patient positioning.  Was recommended to get a non-emergent CT scan of the chest.  Patient and his caregiver were presented with the option of getting CT chest in the ED or outpatient.  Patient did not answer but caregiver said it would be better if patient got the scan here to which the patient did not decline.  CT chest was ordered which showed no acute findings.  Upon reevaluation: Patient did not appear agitated and was stable.  All imaging findings were discussed with the patient and his caretaker.  Patient said he did not have any questions.  Disposition: Patient was stable and was discharged back to his facility  Amount and/or Complexity of Data Reviewed Radiology: ordered.    Details: CT head, C-spine; x-ray of chest and pelvis      Final diagnoses:  None    ED Discharge Orders     None          Edgardo Pontiff, DO 09/11/24 2247    Dean Clarity, MD 09/11/24 2253

## 2024-09-11 NOTE — ED Notes (Signed)
 Dermabond has been applied and a caregiver is at bedside with the patient.

## 2024-09-11 NOTE — ED Triage Notes (Addendum)
 Pt BIBA from [facility] after falling off the toilet, hitting head.  Pt has open wound approximately 3cm on right forehead.  V/S within range

## 2024-09-11 NOTE — Discharge Instructions (Addendum)
 You were seen for fall and a laceration on the forehead.  Your imaging of head, neck, chest, pelvis did not show any acute abnormalities which includes fractures or bleeding.  Used tissue adhesive to repair the laceration on the forehead.  You are stable and ready to be discharged back to facility.  Please go to the nearest ED if you have worsening of symptoms.

## 2024-09-23 ENCOUNTER — Emergency Department (HOSPITAL_COMMUNITY)

## 2024-09-23 ENCOUNTER — Emergency Department (HOSPITAL_COMMUNITY)
Admission: EM | Admit: 2024-09-23 | Discharge: 2024-09-23 | Disposition: A | Discharge status: Home / Self Care | Source: Skilled Nursing Facility | Attending: Emergency Medicine | Admitting: Emergency Medicine

## 2024-09-23 ENCOUNTER — Other Ambulatory Visit: Payer: Self-pay

## 2024-09-23 DIAGNOSIS — W050XXA Fall from non-moving wheelchair, initial encounter: Secondary | ICD-10-CM | POA: Diagnosis not present

## 2024-09-23 DIAGNOSIS — S0181XA Laceration without foreign body of other part of head, initial encounter: Secondary | ICD-10-CM | POA: Diagnosis not present

## 2024-09-23 DIAGNOSIS — W19XXXA Unspecified fall, initial encounter: Secondary | ICD-10-CM

## 2024-09-23 DIAGNOSIS — S0990XA Unspecified injury of head, initial encounter: Secondary | ICD-10-CM | POA: Diagnosis present

## 2024-09-23 NOTE — ED Provider Notes (Signed)
 I received the patient in handoff.  The patient presents with a mechanical fall with CT scans pending.  Plan is for discharge if CT scans are reassuring. Physical Exam  BP (!) 103/55 (BP Location: Left Arm)   Pulse 79   Temp 97.6 F (36.4 C) (Oral)   Resp 20   Ht 5' (1.524 m)   Wt 69.9 kg   SpO2 100%   BMI 30.10 kg/m   Physical Exam General: No acute distress  Procedures  Procedures  ED Course / MDM    Medical Decision Making Amount and/or Complexity of Data Reviewed Radiology: ordered.   The patient's CT scans did not show any acute traumatic injuries.  The patient was discharged with return precautions.       Ula Prentice SAUNDERS, MD 09/23/24 [phone]

## 2024-09-23 NOTE — Discharge Instructions (Signed)
Your evaluation today has been largely reassuring.  But, it is important that you monitor your condition carefully, and do not hesitate to return to the ED if you develop new, or concerning changes in your condition. ° °Otherwise, please follow-up with your physician for appropriate ongoing care. ° °

## 2024-09-23 NOTE — ED Triage Notes (Signed)
 Pt fell out of wc today and has lac to center forehead. No LOC no blood thinners. Pt at baseline mentation

## 2024-09-23 NOTE — ED Provider Notes (Signed)
 [place] EMERGENCY DEPARTMENT AT [facility] Provider Note   CSN: [id number] Arrival date & time: 09/23/24  1418     Patient presents with: Fall and Laceration   Howard Best is a 73 y.o. male.  {Add pertinent medical, surgical, social history, OB history to YEP:67052} HPI Patient presents via EMS from adult daycare facility.  Patient has a history of cerebral palsy, is wheelchair dependent.  Patient fell from his wheelchair, striking his face, sustaining a laceration to his forehead.  EMS reports no hemodynamic instability and transport.  Staff at the facility did not report change in behavior from baseline.  Patient himself denies pain anywhere other than his forehead.  He perseverates on staff at the facility running around prior to his fall.     Prior to Admission medications   Medication Sig Start Date End Date Taking? Authorizing Provider  acetaminophen  (TYLENOL ) 160 MG/5ML solution Take 20 mLs by mouth every 6 (six) hours as needed. Patient not taking: Reported on 08/01/2024    [provider]  acetaminophen  (TYLENOL ) 325 MG tablet Take 650 mg by mouth every 4 (four) hours as needed.    [provider]  alum & mag hydroxide-simeth (MAALOX/MYLANTA) 200-200-20 MG/5ML suspension Take 15 mLs by mouth as needed for indigestion or heartburn.    [provider]  bisacodyl (FLEET) 10 MG/30ML ENEM Place 10 mg rectally as needed.    [provider]  Brompheniramine-Pseudoeph (DIMETAPP PO) Take 20 mLs by mouth as needed.    [provider]  Carbamide Peroxide-Saline (CLEARCANAL EAR WAX REMOVAL) 6.5 % KIT Place in ear(s) as needed.    [provider]  chlorhexidine  (PERIDEX ) 0.12 % solution Use as directed 5 mLs in the mouth or throat at bedtime.    [provider]  Cholecalciferol (VITAMIN D) 50 MCG (2000 UT) CAPS Take 2,000 Units by mouth daily.    [provider]  diazePAM (VALTOCO 5 MG DOSE) 5  MG/0.1ML LIQD Place 1 spray into the nose daily as needed (seizure).    [provider]  diazePAM, 15 MG Dose, (VALTOCO 15 MG DOSE) 2 x 7.5 MG/0.1ML LQPK Place 7.5 mg into the nose as directed. As needed for seizure greater than or equal to 3 minutes. After 4 hours if seizure recurs, repeat dose. Max 1 episode eery 5 days. Alternate nostrils with each spray.    [provider]  Diethyltoluamide (OFF ACTIVE) 15 % AERO Apply topically as needed.    [provider]  diphenhydrAMINE (BENADRYL ALLERGY) 25 MG tablet Take 25 mg by mouth every 6 (six) hours as needed.    [provider]  diphenhydrAMINE (BENADRYL) 12.5 MG/5ML liquid Take 10 mLs by mouth every 6 (six) hours as needed.    [provider]  docusate sodium (COLACE) 100 MG capsule Take 100 mg by mouth daily.    [provider]  DULoxetine  (CYMBALTA ) 60 MG capsule Take 60 mg by mouth daily.    [provider]  guaiFENesin (ROBITUSSIN) 100 MG/5ML liquid Take 5 mLs by mouth every 4 (four) hours as needed for cough or to loosen phlegm.    [provider]  hydrocortisone cream 1 % Apply 1 Application topically as needed for itching.    [provider]  hydrOXYzine (ATARAX) 25 MG tablet Take 25 mg by mouth daily.    [provider]  ibuprofen (ADVIL) 200 MG tablet Take 200-600 mg by mouth as needed.    [provider]  ketoconazole (NIZORAL) 2 % shampoo Apply 1 application. topically every Monday, Wednesday, and Friday.    [provider]  lacosamide  (VIMPAT ) 200 MG TABS tablet Take 200 mg by mouth 2 (two) times daily.    [provider]  Lactobacillus (ACIDOPHILUS/BIFIDUS PO) Take 2 capsules by mouth daily.    [provider]  levETIRAcetam  (KEPPRA ) 750 MG tablet Take 2 tablets (1,500 mg total) by mouth 2 (two) times daily. 08/01/24 07/27/25  Whitfield Raisin, NP  lisinopril  (ZESTRIL ) 10 MG tablet Take 10 mg by mouth every evening.     [provider]  loperamide (IMODIUM A-D) 2 MG tablet Take 4 mg by mouth as needed for diarrhea or loose stools.    [provider]  LORazepam  (ATIVAN ) 1 MG tablet Take 2 mg by mouth as directed. Prior to dental procedures Patient not taking: Reported on 08/01/2024    [provider]  magnesium hydroxide (MILK OF MAGNESIA) 400 MG/5ML suspension Take 30 mLs by mouth as needed for mild constipation.    [provider]  mupirocin ointment (BACTROBAN) 2 % Apply 1 Application topically as needed. Patient not taking: Reported on 08/01/2024    [provider]  neomycin-bacitracin -polymyxin (NEOSPORIN) OINT Apply 1 Application topically as needed for wound care.    [provider]  polyethylene glycol (MIRALAX  / GLYCOLAX ) 17 g packet Take 17 g by mouth every other day.    [provider]  promethazine (PHENERGAN) 12.5 MG tablet Take 12.5 mg by mouth every 12 (twelve) hours as needed for nausea or vomiting.    [provider]  promethazine (PHENERGAN) 25 MG suppository Place 25 mg rectally every 6 (six) hours as needed for nausea or vomiting.    [provider]  Vitamins A & D (VITAMIN A & D) ointment Apply 1 Application topically as needed for dry skin.    [provider]  zinc  oxide (BALMEX) 11.3 % CREA cream Apply 1 Application topically as needed. Patient not taking: Reported on 08/01/2024    [provider]    Allergies: Patient has no known allergies.    Review of Systems  Updated Vital Signs BP (!) 103/55 (BP Location: Left Arm)   Pulse 79   Temp 97.6 F (36.4 C) (Oral)   Resp 20   Ht 1.524 m (5')   Wt 69.9 kg   SpO2 100%   BMI 30.10 kg/m   Physical Exam Vitals and nursing note reviewed.  Constitutional:      General: He is not in acute distress.    Appearance: He is well-developed. He is not ill-appearing, toxic-appearing or diaphoretic.  HENT:     Head: Normocephalic.   Eyes:      Conjunctiva/sclera: Conjunctivae normal.  Cardiovascular:     Rate and Rhythm: Normal rate and regular rhythm.  Pulmonary:     Effort: Pulmonary effort is normal. No respiratory distress.     Breath sounds: No stridor.  Abdominal:     General: There is no distension.  Skin:    General: Skin is warm and dry.  Neurological:     Mental Status: He is alert and oriented to person, place, and time.     Comments: Stigmata of cerebral palsy with contractures, atrophy all present. Speech is brief, clear.  Face is symmetric.  Psychiatric:        Cognition and Memory: Cognition is impaired.     (all labs ordered are listed, but only abnormal results are displayed) Labs Reviewed -  No data to display  EKG: None  Radiology: No results found.  {Document cardiac monitor, telemetry assessment procedure when appropriate:32947} Procedures   Medications Ordered in the ED - No data to display    {Click here for ABCD2, HEART and other calculators REFRESH Note before signing:1}                              Medical Decision Making Adult male with cerebral palsy, baseline intellectual disability presents after mechanical fall with head/face trauma.  Patient's vital signs are initially reassuring, neuroexam is similar to his reported baseline, and the patient is in no distress.  However, with head trauma, advanced age, differential including fracture, intracranial abnormality considered.  CT scans ordered.   Amount and/or Complexity of Data Reviewed Independent Historian: EMS External Data Reviewed: notes. Radiology: ordered and independent interpretation performed. Decision-making details documented in ED Course.  Risk Decision regarding hospitalization. Diagnosis or treatment significantly limited by social determinants of health.   4:10 PM Patient now with a custodian.  On repeat evaluation the patient's forehead wound remains hemostatic, no indication for wound closure.  New dressing  applied.  CT scans pending, patient remains hemodynamically unremarkable, pending CT results patient may be appropriate for discharge.    Final diagnoses:  Fall, initial encounter  Facial laceration, initial encounter

## 2024-10-07 ENCOUNTER — Emergency Department (HOSPITAL_COMMUNITY)
Admission: EM | Admit: 2024-10-07 | Discharge: 2024-10-07 | Discharge status: Against Medical Advice | Source: Skilled Nursing Facility

## 2024-10-07 ENCOUNTER — Emergency Department (HOSPITAL_COMMUNITY)

## 2024-10-07 ENCOUNTER — Other Ambulatory Visit: Payer: Self-pay

## 2024-10-07 ENCOUNTER — Emergency Department (HOSPITAL_COMMUNITY)
Admission: EM | Admit: 2024-10-07 | Discharge: 2024-10-07 | Disposition: A | Discharge status: Home / Self Care | Source: Home / Self Care

## 2024-10-07 ENCOUNTER — Encounter (HOSPITAL_COMMUNITY): Payer: Self-pay

## 2024-10-07 DIAGNOSIS — Z5321 Procedure and treatment not carried out due to patient leaving prior to being seen by health care provider: Secondary | ICD-10-CM | POA: Insufficient documentation

## 2024-10-07 DIAGNOSIS — S0990XA Unspecified injury of head, initial encounter: Secondary | ICD-10-CM

## 2024-10-07 DIAGNOSIS — R519 Headache, unspecified: Secondary | ICD-10-CM | POA: Diagnosis present

## 2024-10-07 DIAGNOSIS — Z79899 Other long term (current) drug therapy: Secondary | ICD-10-CM | POA: Insufficient documentation

## 2024-10-07 DIAGNOSIS — S0081XA Abrasion of other part of head, initial encounter: Secondary | ICD-10-CM | POA: Insufficient documentation

## 2024-10-07 DIAGNOSIS — W050XXA Fall from non-moving wheelchair, initial encounter: Secondary | ICD-10-CM | POA: Insufficient documentation

## 2024-10-07 NOTE — ED Triage Notes (Signed)
 Pt had a fall today out of his wheelchair hitting the front of his head. Pt has marks to his forehead. Caregiver reports that he has been calling her other names, he usually knows her name.

## 2024-10-07 NOTE — ED Provider Triage Note (Signed)
 Emergency Medicine Provider Triage Evaluation Note  RISHITH SIDDOWAY , a 73 y.o. male  was evaluated in triage.  Pt complains of fell  from sitting after undoing seatbelt to wheelchair and hit head on metal corner, acting more confused, calling caretaker wrong name. Otherwise acting normally.  Endorses headache  Denies vision changes, n/v  Review of Systems  Positive: N/a Negative: N/a  Physical Exam  BP 117/71 (BP Location: Right Arm)   Pulse 83   Temp (!) 97.5 F (36.4 C) (Oral)   Resp 16   SpO2 93%  Gen:   Awake, no distress   Resp:  Normal effort  MSK:   Moves extremities without difficulty  Other:    Medical Decision Making  Medically screening exam initiated at 11:41 AM.  Appropriate orders placed.  CONNAR KEATING was informed that the remainder of the evaluation will be completed by another provider, this initial triage assessment does not replace that evaluation, and the importance of remaining in the ED until their evaluation is complete.     Beola Terrall RAMAN, [place] 10/07/24 1144

## 2024-10-07 NOTE — Discharge Instructions (Signed)
 Please follow up with your primary doctor as soon as possible. Please return to the emergency department if you develop fevers, chills, passing out, stroke symptoms such as facial droop, unilateral weakness, difficulty finding words, seizures, lethargy, uncontrolled nausea or vomiting or any new or worsening symptoms that are concerning to you.

## 2024-10-07 NOTE — ED Triage Notes (Signed)
 Pt was here earlier today and ended up getting very upset and started cursing everyone out so the caregiver left with him. He is back with a different caregiver who wants him to be seen. Today he unbuckled his seatbelt and fell face first out of his chair. He had previously fell out of the chair and hit his head causing a scratch to his forehead. Caregiver states that it started bleeding again. The caregiver states he has had multiple falls recently and all scans have been normal but they still want to get him check out.

## 2024-10-07 NOTE — ED Provider Notes (Signed)
  EMERGENCY DEPARTMENT AT [facility] Provider Note   CSN: [id number] Arrival date & time: 10/07/24  1620     Patient presents with: Head Injury   RICCARDO HOLEMAN is a 73 y.o. male.   This is a 73 year old male presenting emergency department for fall.  Reportedly fell out of his wheelchair and struck his head.  No LOC.  Was here in the emergency department earlier, became agitated and left.  Has returned for evaluation.  Caregivers note that he is at his baseline mentation.  He is unable to provide meaningful history.  They note that he fell roughly 9:00 this morning.  No nausea or vomiting and is at his baseline.   Head Injury      Prior to Admission medications   Medication Sig Start Date End Date Taking? Authorizing Provider  acetaminophen  (TYLENOL ) 160 MG/5ML solution Take 20 mLs by mouth every 6 (six) hours as needed. Patient not taking: Reported on 08/01/2024    [provider]  acetaminophen  (TYLENOL ) 325 MG tablet Take 650 mg by mouth every 4 (four) hours as needed.    [provider]  alum & mag hydroxide-simeth (MAALOX/MYLANTA) 200-200-20 MG/5ML suspension Take 15 mLs by mouth as needed for indigestion or heartburn.    [provider]  bisacodyl (FLEET) 10 MG/30ML ENEM Place 10 mg rectally as needed.    [provider]  Brompheniramine-Pseudoeph (DIMETAPP PO) Take 20 mLs by mouth as needed.    [provider]  Carbamide Peroxide-Saline (CLEARCANAL EAR WAX REMOVAL) 6.5 % KIT Place in ear(s) as needed.    [provider]  chlorhexidine  (PERIDEX ) 0.12 % solution Use as directed 5 mLs in the mouth or throat at bedtime.    [provider]  Cholecalciferol (VITAMIN D) 50 MCG (2000 UT) CAPS Take 2,000 Units by mouth daily.    [provider]  diazePAM (VALTOCO 5 MG DOSE) 5 MG/0.1ML LIQD Place 1 spray into the nose daily as needed (seizure).    [provider]  diazePAM, 15 MG  Dose, (VALTOCO 15 MG DOSE) 2 x 7.5 MG/0.1ML LQPK Place 7.5 mg into the nose as directed. As needed for seizure greater than or equal to 3 minutes. After 4 hours if seizure recurs, repeat dose. Max 1 episode eery 5 days. Alternate nostrils with each spray.    [provider]  Diethyltoluamide (OFF ACTIVE) 15 % AERO Apply topically as needed.    [provider]  diphenhydrAMINE (BENADRYL ALLERGY) 25 MG tablet Take 25 mg by mouth every 6 (six) hours as needed.    [provider]  diphenhydrAMINE (BENADRYL) 12.5 MG/5ML liquid Take 10 mLs by mouth every 6 (six) hours as needed.    [provider]  docusate sodium (COLACE) 100 MG capsule Take 100 mg by mouth daily.    [provider]  DULoxetine  (CYMBALTA ) 60 MG capsule Take 60 mg by mouth daily.    [provider]  guaiFENesin (ROBITUSSIN) 100 MG/5ML liquid Take 5 mLs by mouth every 4 (four) hours as needed for cough or to loosen phlegm.    [provider]  hydrocortisone cream 1 % Apply 1 Application topically as needed for itching.    [provider]  hydrOXYzine (ATARAX) 25 MG tablet Take 25 mg by mouth daily.    [provider]  ibuprofen (ADVIL) 200 MG tablet Take 200-600 mg by mouth as needed.    [provider]  ketoconazole (NIZORAL) 2 % shampoo  Apply 1 application. topically every Monday, Wednesday, and Friday.    [provider]  lacosamide  (VIMPAT ) 200 MG TABS tablet Take 200 mg by mouth 2 (two) times daily.    [provider]  Lactobacillus (ACIDOPHILUS/BIFIDUS PO) Take 2 capsules by mouth daily.    [provider]  levETIRAcetam  (KEPPRA ) 750 MG tablet Take 2 tablets (1,500 mg total) by mouth 2 (two) times daily. 08/01/24 07/27/25  Whitfield Raisin, NP  lisinopril  (ZESTRIL ) 10 MG tablet Take 10 mg by mouth every evening.    [provider]  loperamide (IMODIUM A-D) 2 MG tablet Take 4 mg by mouth as needed for diarrhea or  loose stools.    [provider]  LORazepam  (ATIVAN ) 1 MG tablet Take 2 mg by mouth as directed. Prior to dental procedures Patient not taking: Reported on 08/01/2024    [provider]  magnesium hydroxide (MILK OF MAGNESIA) 400 MG/5ML suspension Take 30 mLs by mouth as needed for mild constipation.    [provider]  mupirocin ointment (BACTROBAN) 2 % Apply 1 Application topically as needed. Patient not taking: Reported on 08/01/2024    [provider]  neomycin-bacitracin -polymyxin (NEOSPORIN) OINT Apply 1 Application topically as needed for wound care.    [provider]  polyethylene glycol (MIRALAX  / GLYCOLAX ) 17 g packet Take 17 g by mouth every other day.    [provider]  promethazine (PHENERGAN) 12.5 MG tablet Take 12.5 mg by mouth every 12 (twelve) hours as needed for nausea or vomiting.    [provider]  promethazine (PHENERGAN) 25 MG suppository Place 25 mg rectally every 6 (six) hours as needed for nausea or vomiting.    [provider]  Vitamins A & D (VITAMIN A & D) ointment Apply 1 Application topically as needed for dry skin.    [provider]  zinc  oxide (BALMEX) 11.3 % CREA cream Apply 1 Application topically as needed. Patient not taking: Reported on 08/01/2024    [provider]    Allergies: Patient has no known allergies.    Review of Systems  Updated Vital Signs BP 129/73 (BP Location: Left Arm)   Pulse 90   Temp 98.6 F (37 C) (Oral)   Resp 16   Ht 5' (1.524 m)   Wt 69.9 kg   SpO2 100%   BMI 30.08 kg/m   Physical Exam Vitals and nursing note reviewed.  Constitutional:      General: He is not in acute distress.    Appearance: He is not toxic-appearing.  HENT:     Head: Normocephalic.     Comments: Superficial abrasions to the forehead.      Nose: Nose normal.  Eyes:     Conjunctiva/sclera: Conjunctivae normal.  Cardiovascular:     Rate and Rhythm: Normal  rate and regular rhythm.  Pulmonary:     Effort: Pulmonary effort is normal.     Breath sounds: Normal breath sounds.  Abdominal:     General: Abdomen is flat. There is no distension.     Tenderness: There is no abdominal tenderness.  Skin:    General: Skin is warm and dry.     Capillary Refill: Capillary refill takes less than 2 seconds.  Neurological:     Mental Status: He is alert. Mental status is at baseline.  Psychiatric:        Mood and Affect: Mood normal.        Behavior: Behavior normal.     (  all labs ordered are listed, but only abnormal results are displayed) Labs Reviewed - No data to display  EKG: None  Radiology: No results found.   Procedures   Medications Ordered in the ED - No data to display  Clinical Course as of 10/07/24 2136  Mon Oct 07, 2024  8151 Patient refusing CT scan again. Will discuss with patients brother who makes medical decisions.  [TY]  [id number] I did discuss with patient's brother who is his guardian and makes medical decisions over the phone.  Discussed options of sedating with medications for CT scans versus watchful waiting.  Caregivers in room reports that he is at his baseline and I cannot elicit any complaints from the patient on exam.  His fall was at 9:00 this morning, I discussed with brother that the chance of intracranial hemorrhage is low, but not 0 and the only way to be sure will be to get a CT scan.  However, brother thinks that risks of using sedatives do not outweigh the benefits at this time.  Feel this is reasonable.  I did give strict return precautions.  Will discharge in stable condition, follow-up with PCP. [TY]    Clinical Course User Index [TY] Neysa Caron PARAS, DO                                 Medical Decision Making Is a 72 year old male presenting emergency department for evaluation after a fall.  Does have a history of cerebral palsy/financial disability, history limited secondary to his comorbidities.  Does have  some  superficial abrasions to forehead, but no palpable skull fractures, raccoon eyes.  Did not appear to have focal neurologic deficits.  Per chart review has been seen numerous times for falls.  Will get CT head/C-spine that was ordered earlier.  If negative, will discharge.  See course for further MDM disposition  Amount and/or Complexity of Data Reviewed Independent Historian: caregiver    Details: If the patient is at neurologic baseline. Labs:     Details: Fall mechanical per caregivers.  Acting normal self tolerating p.o.  Low suspicion for acute intrathoracic or intra-abdominal trauma/pathology at this time.  Will forego labs. Radiology: ordered and independent interpretation performed. Decision-making details documented in ED Course.  Risk Decision regarding hospitalization. Diagnosis or treatment significantly limited by social determinants of health. Risk Details: Has caregiver        Final diagnoses:  Minor head injury, initial encounter    ED Discharge Orders     None          Neysa Caron PARAS, DO 10/07/24 2136

## 2024-10-23 ENCOUNTER — Encounter: Payer: Self-pay | Admitting: Podiatry

## 2024-10-23 ENCOUNTER — Ambulatory Visit (INDEPENDENT_AMBULATORY_CARE_PROVIDER_SITE_OTHER): Admitting: Podiatry

## 2024-10-23 VITALS — Ht 60.0 in | Wt 154.0 lb

## 2024-10-23 DIAGNOSIS — B351 Tinea unguium: Secondary | ICD-10-CM | POA: Diagnosis not present

## 2024-10-23 DIAGNOSIS — M79674 Pain in right toe(s): Secondary | ICD-10-CM

## 2024-10-23 DIAGNOSIS — M79675 Pain in left toe(s): Secondary | ICD-10-CM

## 2024-10-23 NOTE — Progress Notes (Signed)
   SUBJECTIVE Patient nonambulatory PMHx cerebral palsy presents to office today with his transport coming from a group home complaining of elongated, thickened nails that cause pain while ambulating in shoes.  Patient is unable to trim their own nails. Patient is here for further evaluation and treatment.  Past Medical History:  Diagnosis Date   Cerebral palsy (HCC)    Epilepsy (HCC)    Intellectual disability    Major depressive disorder, single episode, unspecified     OBJECTIVE General Patient is awake, alert, and oriented x 3 and in no acute distress. Derm Skin is dry and supple bilateral. Negative open lesions or macerations. Remaining integument unremarkable. Nails are tender, long, thickened and dystrophic with subungual debris, consistent with onychomycosis, 1-5 bilateral. No signs of infection noted. Vasc  DP and PT pedal pulses palpable bilaterally. Temperature gradient within normal limits.  Neuro light touch and protective threshold diminished Musculoskeletal Exam nonambulatory in a wheelchair  ASSESSMENT 1.  Pain due to onychomycosis of toenails both  PLAN OF CARE 1. Patient evaluated today.  2. Instructed to maintain good pedal hygiene and foot care.  3. Mechanical debridement of nails 1-5 bilaterally performed using a nail nipper. Filed with dremel without incident.  4. Return to clinic in 3 mos.    Thresa EMERSON Sar, DPM [facility]  Dr. Thresa EMERSON Sar, DPM    [address] [place], [place] [address]                Office [phone]  Fax [phone]

## 2024-11-05 ENCOUNTER — Other Ambulatory Visit: Payer: Self-pay | Admitting: Family Medicine

## 2024-11-05 ENCOUNTER — Ambulatory Visit
Admission: RE | Admit: 2024-11-05 | Discharge: 2024-11-05 | Disposition: A | Discharge status: Home / Self Care | Source: Ambulatory Visit | Attending: Family Medicine | Admitting: Family Medicine

## 2024-11-05 DIAGNOSIS — M62469 Contracture of muscle, unspecified lower leg: Secondary | ICD-10-CM

## 2025-01-23 ENCOUNTER — Telehealth: Payer: Self-pay | Admitting: Neurology

## 2025-01-23 ENCOUNTER — Ambulatory Visit: Admitting: Podiatry

## 2025-01-23 NOTE — Telephone Encounter (Signed)
 MYC cxl

## 2025-07-22 ENCOUNTER — Ambulatory Visit: Admitting: Neurology

## 2025-08-04 ENCOUNTER — Ambulatory Visit: Admitting: Neurology
